# Patient Record
Sex: Male | Born: 1980 | Hispanic: No | Marital: Single | Smoking: Never smoker
Health system: Southern US, Community
[De-identification: ages and names within clinical notes are randomized; demographics above are authoritative.]

## PROBLEM LIST (undated history)

## (undated) DIAGNOSIS — G932 Benign intracranial hypertension: Secondary | ICD-10-CM

## (undated) DIAGNOSIS — E119 Type 2 diabetes mellitus without complications: Secondary | ICD-10-CM

## (undated) DIAGNOSIS — I10 Essential (primary) hypertension: Secondary | ICD-10-CM

## (undated) HISTORY — DX: Benign intracranial hypertension: G93.2

## (undated) HISTORY — PX: EYE SURGERY: SHX253

---

## 2017-06-05 ENCOUNTER — Other Ambulatory Visit: Payer: Self-pay

## 2017-06-05 ENCOUNTER — Emergency Department
Admission: EM | Admit: 2017-06-05 | Discharge: 2017-06-05 | Disposition: A | Discharge status: Home / Self Care | Payer: Self-pay | Attending: Emergency Medicine | Admitting: Emergency Medicine

## 2017-06-05 ENCOUNTER — Encounter: Payer: Self-pay | Admitting: Emergency Medicine

## 2017-06-05 DIAGNOSIS — E119 Type 2 diabetes mellitus without complications: Secondary | ICD-10-CM | POA: Insufficient documentation

## 2017-06-05 DIAGNOSIS — I10 Essential (primary) hypertension: Secondary | ICD-10-CM | POA: Insufficient documentation

## 2017-06-05 DIAGNOSIS — B349 Viral infection, unspecified: Secondary | ICD-10-CM | POA: Insufficient documentation

## 2017-06-05 HISTORY — DX: Type 2 diabetes mellitus without complications: E11.9

## 2017-06-05 LAB — BASIC METABOLIC PANEL
ANION GAP: 8 (ref 5–15)
BUN: 12 mg/dL (ref 6–20)
CO2: 29 mmol/L (ref 22–32)
Calcium: 8.6 mg/dL — ABNORMAL LOW (ref 8.9–10.3)
Chloride: 100 mmol/L — ABNORMAL LOW (ref 101–111)
Creatinine, Ser: 1.47 mg/dL — ABNORMAL HIGH (ref 0.61–1.24)
GFR calc Af Amer: >60 mL/min (ref >60)
GFR, EST NON AFRICAN AMERICAN: 59 mL/min — AB (ref >60)
Glucose, Bld: 179 mg/dL — ABNORMAL HIGH (ref 65–99)
POTASSIUM: 3.1 mmol/L — AB (ref 3.5–5.1)
SODIUM: 137 mmol/L (ref 135–145)

## 2017-06-05 MED ORDER — AMLODIPINE BESYLATE 5 MG PO TABS
5.0000 mg | ORAL_TABLET | Freq: Once | ORAL | Status: AC
  Administered 2017-06-05: 5 mg via ORAL
  Filled 2017-06-05: qty 1

## 2017-06-05 MED ORDER — SODIUM CHLORIDE 0.9 % IV BOLUS (SEPSIS)
1000.0000 mL | Freq: Once | INTRAVENOUS | Status: AC
  Administered 2017-06-05: 1000 mL via INTRAVENOUS

## 2017-06-05 MED ORDER — AMLODIPINE BESYLATE 5 MG PO TABS: 5.0000 mg | ORAL_TABLET | Freq: Every day | ORAL | 1 refills | Status: DC

## 2017-06-05 MED ORDER — ONDANSETRON 4 MG PO TBDP: 4.0000 mg | ORAL_TABLET | Freq: Three times a day (TID) | ORAL | 0 refills | Status: DC | PRN

## 2017-06-05 MED ORDER — POTASSIUM CHLORIDE CRYS ER 20 MEQ PO TBCR
40.0000 meq | EXTENDED_RELEASE_TABLET | Freq: Once | ORAL | Status: AC
  Administered 2017-06-05: 40 meq via ORAL
  Filled 2017-06-05: qty 2

## 2017-06-05 MED ORDER — KETOROLAC TROMETHAMINE 30 MG/ML IJ SOLN
15.0000 mg | INTRAMUSCULAR | Status: AC
  Administered 2017-06-05: 15 mg via INTRAVENOUS
  Filled 2017-06-05: qty 1

## 2017-06-05 NOTE — ED Notes (Signed)
Pt given graham crackers, peanut butter, and diet sprite

## 2017-06-05 NOTE — ED Provider Notes (Signed)
[facility] Emergency Department Provider Note  ____________________________________________  Time seen: Approximately 8:54 PM  I have reviewed the triage vital signs and the nursing notes.   HISTORY  Chief Complaint Fever    HPI William Zuniga is a 36 y.o. male who complains of subjective fevers and chills, sore throat, body aches, nonproductive cough for the past 2 days. He feels tired. Gradual onset, constant, no aggravating or alleviating factors. Mild to moderate intensity. Took some ibuprofen which didn't help. Denies chest pain or shortness of breath. No dizziness or syncope.  Reports in the past she had been told that he had diabetes, used to take metformin but stopped taking a while ago due to financial reasons. No other known medical problems.     Past Medical History:  Diagnosis Date  . Diabetes mellitus without complication (HCC)      There are no active problems to display for this patient.    History reviewed. No pertinent surgical history.   Prior to Admission medications   Medication Sig Start Date End Date Taking? Authorizing Provider  amLODipine (NORVASC) 5 MG tablet Take 1 tablet (5 mg total) by mouth daily. 06/05/17   Sharman CheekStafford, Lakeshia Dohner, MD  ondansetron (ZOFRAN ODT) 4 MG disintegrating tablet Take 1 tablet (4 mg total) by mouth every 8 (eight) hours as needed for nausea or vomiting. 06/05/17   Sharman CheekStafford, Halen Mossbarger, MD     Allergies Patient has no known allergies.   No family history on file.  Social History Social History   Tobacco Use  . Smoking status: Never Smoker  . Smokeless tobacco: Never Used  Substance Use Topics  . Alcohol use: Not on file  . Drug use: Not on file  No alcohol or drug use  Review of Systems  Constitutional:   Positive subjective fevers and chills  ENT:   Positive sore throat. Positive rhinorrhea. Cardiovascular:   No chest pain or syncope. Respiratory:   No dyspnea positive nonproductive  cough. Gastrointestinal:   Negative for abdominal pain, vomiting and diarrhea.  Musculoskeletal:   Negative for focal pain or swelling All other systems reviewed and are negative except as documented above in ROS and HPI.  ____________________________________________   PHYSICAL EXAM:  VITAL SIGNS: ED Triage Vitals  Enc Vitals Group     BP 06/05/17 1721 (!) 228/134     Pulse Rate 06/05/17 1721 (!) 106     Resp 06/05/17 1721 18     Temp 06/05/17 1721 98.4 F (36.9 C)     Temp Source 06/05/17 1721 Oral     SpO2 06/05/17 1721 95 %     Weight 06/05/17 1716 240 lb (108.9 kg)     Height 06/05/17 1716 5\' 9"  (1.753 m)     Head Circumference --      Peak Flow --      Pain Score 06/05/17 1716 7     Pain Loc --      Pain Edu? --      Excl. in GC? --     Vital signs reviewed, nursing assessments reviewed.   Constitutional:   Alert and oriented. Well appearing and in no distress. Eyes:   No scleral icterus.  EOMI. No nystagmus. No conjunctival pallor. PERRL. ENT   Head:   Normocephalic and atraumatic.   Nose:   No congestion/rhinnorhea.    Mouth/Throat:   MMM, no pharyngeal erythema. No peritonsillar mass.    Neck:   No meningismus. Full ROM. Hematological/Lymphatic/Immunilogical:   No cervical  lymphadenopathy. Cardiovascular:   Tachycardia heart rate 105. Symmetric bilateral radial and DP pulses.  No murmurs.  Respiratory:   Normal respiratory effort without tachypnea/retractions. Breath sounds are clear and equal bilaterally. No wheezes/rales/rhonchi. Gastrointestinal:   Soft and nontender. Non distended. There is no CVA tenderness.  No rebound, rigidity, or guarding. Genitourinary:   deferred Musculoskeletal:   Normal range of motion in all extremities. No joint effusions.  No lower extremity tenderness.  No edema. Neurologic:   Normal speech and language.  Motor grossly intact. No gross focal neurologic deficits are appreciated.  Skin:    Skin is warm, dry and  intact. No rash noted.  No petechiae, purpura, or bullae.  ____________________________________________    LABS (pertinent positives/negatives) (all labs ordered are listed, but only abnormal results are displayed) Labs Reviewed  BASIC METABOLIC PANEL - Abnormal; Notable for the following components:      Result Value   Potassium 3.1 (*)    Chloride 100 (*)    Glucose, Bld 179 (*)    Creatinine, Ser 1.47 (*)    Calcium 8.6 (*)    GFR calc non Af Amer 59 (*)    All other components within normal limits   ____________________________________________   EKG    ____________________________________________    RADIOLOGY  No results found.  ____________________________________________   PROCEDURES Procedures  ____________________________________________     CLINICAL IMPRESSION / ASSESSMENT AND PLAN / ED COURSE  Pertinent labs & imaging results that were available during my care of the patient were reviewed by me and considered in my medical decision making (see chart for details).   Patient well-appearing no acute distress, presents with mild tachycardia at 105, significantly elevated blood pressure of about 200/130. Low suspicion of stroke ACS PE or dissection. His symptoms are entirely consistent with a viral URI, but due to his reported history of diabetes without being on any medication, check labs to ensure that he doesn't have any evidence of hyperglycemia or acidosis while ensuring that he can tolerate oral intake. I will plan to start him on amlodipine due to the degree to which his blood pressure is elevated. Recommended he follow up with her primary care doctor.  Clinical Course as of Jun 05 2053  Wed Jun 05, 2017  2018 Tachycardia improved with IVF. Will give oral potassium replacement. Pt tolerating PO. Wil DC home, sx mgmt.   [PS]    Clinical Course User Index [PS] Sharman CheekStafford, Pate Aylward, MD     ____________________________________________   FINAL  CLINICAL IMPRESSION(S) / ED DIAGNOSES    Final diagnoses:  Hypertension, unspecified type  Viral syndrome      This SmartLink is deprecated. Use AVSMEDLIST instead to display the medication list for a patient.   Portions of this note were generated with dragon dictation software. Dictation errors may occur despite best attempts at proofreading.    Sharman CheekStafford, Nickholas Goldston, MD 06/05/17 2059

## 2017-06-05 NOTE — ED Notes (Signed)
Pt c/o cough, congestion, weakness with generalized body aches for 2-3 days - pt also c/o bilat eye redness (denies itching) Pt c/o hazy vision and has elevated BP of 199/128 at this time

## 2017-06-05 NOTE — ED Triage Notes (Signed)
C/O fever, sore throat, body aches x 2 days.

## 2017-06-05 NOTE — Discharge Instructions (Signed)
Your blood sugar today was 170. You should follow up with a primary care doctor for continued monitoring of your blood pressure and blood sugar.

## 2017-07-26 ENCOUNTER — Emergency Department (HOSPITAL_COMMUNITY): Payer: Medicaid Other

## 2017-07-26 ENCOUNTER — Other Ambulatory Visit: Payer: Self-pay

## 2017-07-26 ENCOUNTER — Inpatient Hospital Stay (HOSPITAL_COMMUNITY)
Admission: EM | Admit: 2017-07-26 | Discharge: 2017-07-31 | DRG: 103 | Disposition: A | Discharge status: Other Acute Inpatient Hospital | Payer: Medicaid Other | Attending: Internal Medicine | Admitting: Internal Medicine

## 2017-07-26 ENCOUNTER — Encounter (HOSPITAL_COMMUNITY): Payer: Self-pay | Admitting: *Deleted

## 2017-07-26 DIAGNOSIS — E876 Hypokalemia: Secondary | ICD-10-CM | POA: Diagnosis present

## 2017-07-26 DIAGNOSIS — E118 Type 2 diabetes mellitus with unspecified complications: Secondary | ICD-10-CM | POA: Diagnosis not present

## 2017-07-26 DIAGNOSIS — I6783 Posterior reversible encephalopathy syndrome: Secondary | ICD-10-CM | POA: Diagnosis present

## 2017-07-26 DIAGNOSIS — R509 Fever, unspecified: Secondary | ICD-10-CM | POA: Diagnosis not present

## 2017-07-26 DIAGNOSIS — J811 Chronic pulmonary edema: Secondary | ICD-10-CM | POA: Diagnosis present

## 2017-07-26 DIAGNOSIS — R0600 Dyspnea, unspecified: Secondary | ICD-10-CM | POA: Diagnosis not present

## 2017-07-26 DIAGNOSIS — G932 Benign intracranial hypertension: Secondary | ICD-10-CM

## 2017-07-26 DIAGNOSIS — I161 Hypertensive emergency: Secondary | ICD-10-CM | POA: Diagnosis present

## 2017-07-26 DIAGNOSIS — E119 Type 2 diabetes mellitus without complications: Secondary | ICD-10-CM | POA: Diagnosis not present

## 2017-07-26 DIAGNOSIS — I10 Essential (primary) hypertension: Secondary | ICD-10-CM | POA: Diagnosis present

## 2017-07-26 DIAGNOSIS — E669 Obesity, unspecified: Secondary | ICD-10-CM | POA: Diagnosis not present

## 2017-07-26 DIAGNOSIS — Z6841 Body Mass Index (BMI) 40.0 and over, adult: Secondary | ICD-10-CM

## 2017-07-26 DIAGNOSIS — E139 Other specified diabetes mellitus without complications: Secondary | ICD-10-CM | POA: Diagnosis present

## 2017-07-26 DIAGNOSIS — E1165 Type 2 diabetes mellitus with hyperglycemia: Secondary | ICD-10-CM | POA: Diagnosis not present

## 2017-07-26 DIAGNOSIS — H547 Unspecified visual loss: Secondary | ICD-10-CM

## 2017-07-26 DIAGNOSIS — I169 Hypertensive crisis, unspecified: Secondary | ICD-10-CM

## 2017-07-26 DIAGNOSIS — H4710 Unspecified papilledema: Secondary | ICD-10-CM | POA: Diagnosis present

## 2017-07-26 DIAGNOSIS — Z8249 Family history of ischemic heart disease and other diseases of the circulatory system: Secondary | ICD-10-CM

## 2017-07-26 DIAGNOSIS — N179 Acute kidney failure, unspecified: Secondary | ICD-10-CM | POA: Diagnosis not present

## 2017-07-26 DIAGNOSIS — J9600 Acute respiratory failure, unspecified whether with hypoxia or hypercapnia: Secondary | ICD-10-CM

## 2017-07-26 DIAGNOSIS — Z79899 Other long term (current) drug therapy: Secondary | ICD-10-CM | POA: Diagnosis not present

## 2017-07-26 DIAGNOSIS — E1159 Type 2 diabetes mellitus with other circulatory complications: Secondary | ICD-10-CM | POA: Diagnosis present

## 2017-07-26 DIAGNOSIS — Z9114 Patient's other noncompliance with medication regimen: Secondary | ICD-10-CM | POA: Diagnosis not present

## 2017-07-26 HISTORY — DX: Essential (primary) hypertension: I10

## 2017-07-26 LAB — BASIC METABOLIC PANEL
ANION GAP: 11 (ref 5–15)
BUN: 11 mg/dL (ref 6–20)
CALCIUM: 9.3 mg/dL (ref 8.9–10.3)
CHLORIDE: 101 mmol/L (ref 101–111)
CO2: 25 mmol/L (ref 22–32)
Creatinine, Ser: 1.27 mg/dL — ABNORMAL HIGH (ref 0.61–1.24)
GFR calc non Af Amer: >60 mL/min (ref >60)
GLUCOSE: 255 mg/dL — AB (ref 65–99)
POTASSIUM: 3.5 mmol/L (ref 3.5–5.1)
Sodium: 137 mmol/L (ref 135–145)

## 2017-07-26 LAB — MAGNESIUM: MAGNESIUM: 1.9 mg/dL (ref 1.7–2.4)

## 2017-07-26 LAB — CBC
HEMATOCRIT: 43.5 % (ref 39.0–52.0)
Hemoglobin: 14.9 g/dL (ref 13.0–17.0)
MCH: 30.4 pg (ref 26.0–34.0)
MCHC: 34.3 g/dL (ref 30.0–36.0)
MCV: 88.8 fL (ref 78.0–100.0)
Platelets: 317 10*3/uL (ref 150–400)
RBC: 4.9 MIL/uL (ref 4.22–5.81)
RDW: 13.3 % (ref 11.5–15.5)
WBC: 7.8 10*3/uL (ref 4.0–10.5)

## 2017-07-26 LAB — GLUCOSE, CAPILLARY
GLUCOSE-CAPILLARY: 148 mg/dL — AB (ref 65–99)
Glucose-Capillary: 200 mg/dL — ABNORMAL HIGH (ref 65–99)

## 2017-07-26 LAB — BRAIN NATRIURETIC PEPTIDE: B Natriuretic Peptide: 35.6 pg/mL (ref 0.0–100.0)

## 2017-07-26 LAB — I-STAT TROPONIN, ED: TROPONIN I, POC: 0.04 ng/mL (ref 0.00–0.08)

## 2017-07-26 LAB — HEMOGLOBIN A1C
Hgb A1c MFr Bld: 8.9 % — ABNORMAL HIGH (ref 4.8–5.6)
MEAN PLASMA GLUCOSE: 208.73 mg/dL

## 2017-07-26 MED ORDER — NICARDIPINE HCL IN NACL 20-0.86 MG/200ML-% IV SOLN
5.0000 mg/h | Freq: Once | INTRAVENOUS | Status: AC
  Administered 2017-07-26: 5 mg/h via INTRAVENOUS
  Filled 2017-07-26: qty 200

## 2017-07-26 MED ORDER — NICARDIPINE HCL IN NACL 20-0.86 MG/200ML-% IV SOLN
3.0000 mg/h | INTRAVENOUS | Status: DC
  Administered 2017-07-26: 3 mg/h via INTRAVENOUS
  Administered 2017-07-27 (×2): 5 mg/h via INTRAVENOUS
  Filled 2017-07-26 (×3): qty 200

## 2017-07-26 MED ORDER — POTASSIUM CHLORIDE CRYS ER 20 MEQ PO TBCR
40.0000 meq | EXTENDED_RELEASE_TABLET | Freq: Once | ORAL | Status: AC
  Administered 2017-07-26: 40 meq via ORAL
  Filled 2017-07-26: qty 2

## 2017-07-26 MED ORDER — GADOBENATE DIMEGLUMINE 529 MG/ML IV SOLN
20.0000 mL | Freq: Once | INTRAVENOUS | Status: AC
  Administered 2017-07-26: 20 mL via INTRAVENOUS

## 2017-07-26 MED ORDER — NICARDIPINE HCL IN NACL 20-0.86 MG/200ML-% IV SOLN
INTRAVENOUS | Status: AC
  Filled 2017-07-26: qty 200

## 2017-07-26 MED ORDER — LORAZEPAM 2 MG/ML IJ SOLN
1.0000 mg | Freq: Once | INTRAMUSCULAR | Status: AC
  Administered 2017-07-26: 1 mg via INTRAVENOUS
  Filled 2017-07-26: qty 1

## 2017-07-26 NOTE — ED Provider Notes (Signed)
[facility] EMERGENCY DEPARTMENT Provider Note   CSN: [id number] Arrival date & time: 07/26/17  1216     History   Chief Complaint Chief Complaint  Patient presents with  . Hypertension     HPI   Blood pressure (!) 196/130, pulse 72, temperature 98 F (36.7 C), temperature source Oral, resp. rate 18, SpO2 100 %.  William Zuniga is a 37 y.o. male from retinal specialist for evaluation of hypertensive emergency, with decreased vision and disc edema..  Patient has had decreasing vision in the bilateral eyes worsening over the course of the last several months, he states he does not drive anymore and he states he actually has difficulty walking due to decrease in his vision.  He does not have a primary care secondary to insurance issues.  He was seen at the ED several months ago and was given a prescription for amlodipine which he says he is compliant with, he takes it every night.  He did not take it last night however.  He had chest pain several months ago but this resolves he has persistent shortness of breath and dyspnea on exertion.  He denies any orthopnea, PND, active chest pain.  He denies any tobacco use, cocaine or methamphetamine use he drinks alcohol intermittently.  Past Medical History:  Diagnosis Date  . Diabetes mellitus without complication (HCC)   . Hypertension     There are no active problems to display for this patient.   History reviewed. No pertinent surgical history.     Home Medications    Prior to Admission medications   Medication Sig Start Date End Date Taking? Authorizing Provider  amLODipine (NORVASC) 5 MG tablet Take 1 tablet (5 mg total) by mouth daily. 06/05/17   Sharman Cheek, MD  ondansetron (ZOFRAN ODT) 4 MG disintegrating tablet Take 1 tablet (4 mg total) by mouth every 8 (eight) hours as needed for nausea or vomiting. 06/05/17   Sharman Cheek, MD    Family History History reviewed. No pertinent family  history.  Social History Social History   Tobacco Use  . Smoking status: Never Smoker  . Smokeless tobacco: Never Used  Substance Use Topics  . Alcohol use: No    Frequency: Never  . Drug use: No     Allergies   Patient has no known allergies.   Review of Systems Review of Systems  A complete review of systems was obtained and all systems are negative except as noted in the HPI and PMH.   Physical Exam Updated Vital Signs BP (!) 200/129   Pulse 77   Temp 98 F (36.7 C) (Oral)   Resp 18   SpO2 98%   Physical Exam  Constitutional: He is oriented to person, place, and time. He appears well-developed and well-nourished. No distress.  HENT:  Head: Normocephalic and atraumatic.  Mouth/Throat: Oropharynx is clear and moist.  Eyes: Conjunctivae and EOM are normal.  Pupils dilated  Neck: Normal range of motion.  Cardiovascular: Normal rate, regular rhythm and intact distal pulses.  Equal pulses bilaterally  Pulmonary/Chest: Effort normal and breath sounds normal. No stridor. No respiratory distress. He has no wheezes. He has no rales. He exhibits no tenderness.  Abdominal: Soft. He exhibits no distension and no mass. There is no tenderness. There is no rebound and no guarding. No hernia.  Musculoskeletal: Normal range of motion. He exhibits no edema.  Neurological: He is alert and oriented to person, place, and time.  V/VII-Smile symmetric, equal eyebrow  raise,  facial sensation intact VIII- Hearing grossly intact IX/X-Normal gag XI-bilateral shoulder shrug XII-midline tongue extension Motor: 5/5 bilaterally with normal tone and bulk    Skin: Capillary refill takes less than 2 seconds. He is not diaphoretic.  Psychiatric: He has a normal mood and affect.  Nursing note and vitals reviewed.    ED Treatments / Results  Labs (all labs ordered are listed, but only abnormal results are displayed) Labs Reviewed  BASIC METABOLIC PANEL - Abnormal; Notable for the  following components:      Result Value   Glucose, Bld 255 (*)    Creatinine, Ser 1.27 (*)    All other components within normal limits  CBC  BRAIN NATRIURETIC PEPTIDE  MAGNESIUM  I-STAT TROPONIN, ED    EKG  EKG Interpretation  Date/Time:  Friday July 26 2017 12:26:25 EST Ventricular Rate:  74 PR Interval:  156 QRS Duration: 104 QT Interval:  388 QTC Calculation: 430 R Axis:   61 Text Interpretation:  Normal sinus rhythm Inferior infarct , age undetermined ST & T wave abnormality, consider lateral ischemia Abnormal ECG No old tracing to compare Confirmed by Shaune PollackIsaacs, Cameron [phone]([id number]) on 07/26/2017 1:58:26 PM       Radiology Dg Chest 2 View  Result Date: 07/26/2017 CLINICAL DATA:  Hypertensive crisis. Occasional shortness of breath and chest pain. EXAM: CHEST  2 VIEW COMPARISON:  None. FINDINGS: Cardiomegaly and pulmonary vascular congestion noted. There is no evidence of focal airspace disease, pulmonary edema, suspicious pulmonary nodule/mass, pleural effusion, or pneumothorax. No acute bony abnormalities are identified. IMPRESSION: Cardiomegaly with pulmonary vascular congestion. Electronically Signed   By: Harmon PierJeffrey  Hu M.D.   On: 07/26/2017 12:56    Procedures Procedures (including critical care time)  CRITICAL CARE Performed by: Joni ReiningNicole Hannahmarie Asberry   Total critical care time: 45 minutes  Critical care time was exclusive of separately billable procedures and treating other patients.  Critical care was necessary to treat or prevent imminent or life-threatening deterioration.  Critical care was time spent personally by me on the following activities: development of treatment plan with patient and/or surrogate as well as nursing, discussions with consultants, evaluation of patient's response to treatment, examination of patient, obtaining history from patient or surrogate, ordering and performing treatments and interventions, ordering and review of laboratory studies, ordering  and review of radiographic studies, pulse oximetry and re-evaluation of patient's condition.   Medications Ordered in ED Medications  potassium chloride SA (K-DUR,KLOR-CON) CR tablet 40 mEq (not administered)  nicardipine (CARDENE) 20mg  in 0.86% saline 200ml IV infusion (0.1 mg/ml) (5 mg/hr Intravenous New Bag/Given 07/26/17 1454)     Initial Impression / Assessment and Plan / ED Course  I have reviewed the triage vital signs and the nursing notes.  Pertinent labs & imaging results that were available during my care of the patient were reviewed by me and considered in my medical decision making (see chart for details).     Vitals:   07/26/17 1223 07/26/17 1224 07/26/17 1430  BP: (!) 211/125 (!) 196/130 (!) 200/129  Pulse: 72  77  Resp: 18    Temp: 98 F (36.7 C)    TempSrc: Oral    SpO2: 100%  98%    Medications  potassium chloride SA (K-DUR,KLOR-CON) CR tablet 40 mEq (not administered)  nicardipine (CARDENE) 20mg  in 0.86% saline 200ml IV infusion (0.1 mg/ml) (5 mg/hr Intravenous New Bag/Given 07/26/17 1454)    Geralyn FlashJoseph Sanjuan is 37 y.o. male presenting with hypertensive emergency with endorgan damage,  decreased vision, disc edema, patient with shortness of breath, chest x-ray consistent with CHF.  BNP pending, EKG with nonspecific changes.  Patient started on nicardipine drip. Goal SBP 160. Mild hypokalemia, elevated glucose with normal anion gap, creatinine mildly elevated at 1.27.  Unassigned admission to [facility], head CT ordered and pending.    Final Clinical Impressions(s) / ED Diagnoses   Final diagnoses:  Hypertensive crisis    ED Discharge Orders    None       Lisa Blakeman, Mardella Layman 07/26/17 1500    Shaune Pollack, MD 07/26/17 1611    Shaune Pollack, MD 07/26/17 1718    Shaune Pollack, MD 07/26/17 Paulo Fruit

## 2017-07-26 NOTE — ED Triage Notes (Signed)
Pt sent here from dr office. Reports having vision changes since dec. Went to dr today and sent here for hypertensive crisis, causing disc edema in bilateral eyes and blindness. Has occ sob. No acute distress is noted at triage.

## 2017-07-26 NOTE — ED Notes (Signed)
Patient to go upstairs when he returns from MRI.

## 2017-07-26 NOTE — H&P (Signed)
[facility] Pulmonary Diseases & Critical Care Medicine HISTORY & PHYSICAL  Patient Name: William Zuniga MRN: [id number] DOB: [birth year]    ADMISSION DATE:  07/26/2017 DATE OF SERVICE:  07/26/2017  CHIEF COMPLAINT:  B papilledema   HISTORY OF PRESENT ILLNESS  This 37 y.o. 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 The patient has been experiencing rapidly progressive deterioration of his visual acuity over the past several months. He does not discern light and dark at this time. He used to have normal vision and was able to drive. 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 The patient's wife is at the bedside. She reports that she can detect that the patient's blood pressure is elevated due to changes in personality and behavior. In the ER, the patient has apparently "required" Cardene gtt for blood pressure control, prompting request to admit to ICU.  He has had hypertension for many years. He admits to nonadherence to prescribed medications, citing lack of health insurance coverage. He also claims to be diabetic, which he thinks was first diagnosed at age 67 or 47. He was briefly on insulin but says that he was "able to get off of insulin".  REVIEW OF SYSTEMS Constitutional: No weight loss. No night sweats. No fever. No chills. No fatigue. HEENT: No headaches, dysphagia, sore throat, otalgia, nasal congestion, PND CV:  chest pain. No orthopnea, PND, swelling in lower extremities, palpitations GI:  No abdominal pain, nausea, vomiting, diarrhea, change in bowel pattern, anorexia Resp: DOE. No cough, mucus, hemoptysis, wheezing  GU: no dysuria, change in color of urine, no urgency or frequency.  No flank pain. MS:  No joint pain or swelling. No myalgias,   No decreased range of motion.  Psych:  No change in mood or affect. No memory loss. Skin: no rash or lesions.   PAST MEDICAL/SURGICAL/SOCIAL/FAMILY HISTORIES   Past Medical History:  Diagnosis Date  . Diabetes mellitus without complication (HCC)   . Hypertension     History reviewed. No pertinent surgical history.  Social History   Tobacco Use  . Smoking status: Never Smoker  . Smokeless tobacco: Never Used  Substance Use Topics  . Alcohol use: No    Frequency: Never    History reviewed. No pertinent family history.   No Known Allergies   Prior to Admission medications   Medication Sig Start Date End Date Taking? Authorizing Provider  amLODipine (NORVASC) 5 MG tablet Take 1 tablet (5 mg total) by mouth daily. 06/05/17  Yes Sharman Cheek, MD  ondansetron (ZOFRAN ODT) 4 MG disintegrating tablet Take 1 tablet (4 mg total) by mouth every 8 (eight) hours as needed for nausea or vomiting. Patient not taking: Reported on 07/26/2017 06/05/17   Sharman Cheek, MD    Current Facility-Administered Medications  Medication Dose Route Frequency Provider Last Rate Last Dose  . niCARdipine in saline (CARDENE-IV) 20-0.86 MG/200ML-% infusion SOLN            Current Outpatient Medications  Medication Sig Dispense Refill  . amLODipine (NORVASC) 5 MG tablet Take 1 tablet (5 mg total) by mouth daily. 30 tablet 1  . ondansetron (ZOFRAN ODT) 4 MG disintegrating tablet Take 1 tablet (4 mg total) by mouth every 8 (eight) hours as needed for nausea or vomiting. (Patient not taking: Reported  on 07/26/2017) 20 tablet 0     VITAL SIGNS: BP (!) 149/91   Pulse 95   Temp 98 F (36.7 C) (Oral)   Resp (!) 23   SpO2 95%   HEMODYNAMICS:    VENTILATOR SETTINGS:    INTAKE / OUTPUT: No intake/output data recorded.  PHYSICAL EXAMINATION: General:  Pleasant. Well-developed. Alert, awake and oriented to time, person and place. Cooperative. No acute distress. Normal affect. Head:  normocephalic, atraumatic Nose: nares are patent. No polyps. No exudate. No sinus tenderness. Throat/Oral Cavity: Normal dentition. No oral thrush. No exudate. Mucous membranes are moist. No tonsillar enlargement. Neck: supple, no thyromegaly, no JVD, no lymphadenopathy. Trachea midline. Chest/Lung: symmetric in development and expansion. Good air entry. no crackles. No wheezes. Heart: Regular S1 and S2 without murmur, rub or gallop. Abdomen: soft, nontender, nondistended. Normoactive bowel sounds. n rebound. No guarding. Extremities: no pedal edema, no cyanosis, no clubbing. 2+ DP pulses Lymphatic: no cervical/axiallary/inguinal lymph nodes appreciated Skin:  No rash or lesion. NEURO: cranial nerves II-XII are grossly symmetric and physiologic. Babinski absent. No sensory deficit. No motor deficit. DTR 2+ @ RUE, 2+ @ LUE 2+ @ RLL,  2+ @ LLL. No cerebellar signs. Gait was not assessed.   LABS:  BMET Recent Labs  Lab 07/26/17 1222  NA 137  K 3.5  CL 101  CO2 25  BUN 11  CREATININE 1.27*  GLUCOSE 255*    Electrolytes Recent Labs  Lab 07/26/17 1222  CALCIUM 9.3  MG 1.9    CBC Recent Labs  Lab 07/26/17 1222  WBC 7.8  HGB 14.9  HCT 43.5  PLT 317    Coag's No results for input(s): APTT, INR in the last 168 hours.  Sepsis Markers No results for input(s): LATICACIDVEN, PROCALCITON, O2SATVEN in the last 168 hours.  ABG No results for input(s): PHART, PCO2ART, PO2ART in the last 168 hours.  Liver Enzymes No results for input(s): AST, ALT, ALKPHOS, BILITOT, ALBUMIN in the last 168 hours.  Cardiac Enzymes No results for input(s): TROPONINI, PROBNP in the last 168 hours.  Glucose No results for input(s): GLUCAP in the last 168 hours.  Imaging Dg Chest 2 View  Result Date: 07/26/2017 CLINICAL DATA:  Hypertensive crisis. Occasional shortness of breath and chest pain. EXAM: CHEST  2 VIEW COMPARISON:  None. FINDINGS: Cardiomegaly and pulmonary vascular congestion  noted. There is no evidence of focal airspace disease, pulmonary edema, suspicious pulmonary nodule/mass, pleural effusion, or pneumothorax. No acute bony abnormalities are identified. IMPRESSION: Cardiomegaly with pulmonary vascular congestion. Electronically Signed   By: Harmon PierJeffrey  Hu M.D.   On: 07/26/2017 12:56   Ct Head Wo Contrast  Result Date: 07/26/2017 CLINICAL DATA:  Neuro deficits, subacute. Bilateral visual changes. Hypertensive crisis. Disc edema. EXAM: CT HEAD WITHOUT CONTRAST TECHNIQUE: Contiguous axial images were obtained from the base of the skull through the vertex without intravenous contrast. COMPARISON:  None. FINDINGS: Brain: Bilateral subcortical white matter hypoattenuation is advanced for age. This is predominantly in the frontal lobes. No acute cortical infarct, hemorrhage, or mass lesion is present. Ventricles are of normal size. Basal ganglia are intact. The brainstem and cerebellum are normal. Vascular: No hyperdense vessel or unexpected calcification. Skull: Calvarium is intact. No focal lytic or blastic lesion is present. Sinuses/Orbits: The paranasal sinuses and mastoid air cells are clear. The globes and orbits are within normal limits. No mass lesion is present. Cavernous sinus is normal bilaterally. Sella is unremarkable. IMPRESSION: 1. Bilateral subcortical white matter hypoattenuation is moderately  advanced for age. The pattern is more anterior than posterior, atypical for posterior reversible encephalopathy syndrome. This raises concern for chronic underlying microvascular disease. 2. No acute cortical defect. 3. No evidence for hemorrhage. Electronically Signed   By: Marin Roberts M.D.   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 SBP goal for tonight 160 Consult social work re: assistance w/medications, appointments Fasting lipid panel in am Check HbA1c Accuchecks AC HS Continuous oximetry. This patient should undergo diagnostic polysomnography to rule out sleep-disordered breathing.  NUTRITION: cardiac/diabetic  DVT PROPHYLAXIS: SCDs for now GI PROPHYLAXIS: famotidine   Marcelle Smiling, MD Board Certified by the ABIM, Pulmonary Diseases & Critical Care Medicine  [facility] Pager: [phone]  07/26/2017, 7:00 PM

## 2017-07-26 NOTE — H&P (Signed)
Date: 07/26/2017               Patient Name:  William Zuniga MRN: [id number]  DOB: 19-Apr-1981 Age / Sex: 37 y.o., male   PCP: Patient, No Pcp Per         Medical Service: Internal Medicine Teaching Service         Attending Physician: Dr. Shaune Pollack, MD    First Contact: Dr. Evelene Croon Pager: [phone]  Second Contact: Dr. Obie Dredge  Pager [phone]       After Hours (After 5p/  First Contact Pager: [phone]  weekends / holidays): Second Contact Pager: [phone]   Chief Complaint: Decreased vision   History of Present Illness:  William Zuniga is a 37 y.o. Male with history of HTN and non-insulin dependent T2DM who presents to the ED from ophthalmology office with papilledema and decreased vision. Patient is accompanied by girlfriend/wife. Patient was diagnosed with HTN about 1 year ago (he is unable to specify) after an ED visit. He did not establish with a PCP after this diagnosis. He takes his BP at home at random times and is sBP usually 170-190s. A few months ago he was seen in the ED in [place] and noted to be hypertensive. He was prescribed amlodipine 5 mg which he has been taking daily. Some time after Thanksgiving, he noticed changes in his vision. He wears eye glasses and thought this was normal. Currently, he is only able to distinguish some colors and shapes. His vision continued to worsen and he decide to schedule an appointment with ophthalmology today.  During his appointment, he was found to have a BP of 171/93 and had bilateral papilledema, and was sent to the ED for HTN management. Per office note, patient is currently legally blind. For the past 2 months he has been experiencing daily headaches and has noticed increased fatigued especially during exertion.  He also endorses mild chest pain on exertion.  He has not noticed any changes in his weight.  Denies recent illness, fever, chills, active chest pain, shortness of breath, abdominal pain, nausea or vomiting, changes in bowel  movements, and lower extremity swelling.  ED course: Patient's ;BP 228/134 on arrival to the ED. Labwork remarkable for hypokalemia K 3.1, I-stat troponin of 0.04 and mild AKI SCr 1.2. BNP 35.6. EKG with TWI in I, II, III, aVF, V5-V6. CXR consistent with pulmonary edema. Head CT with no acute intracranial abnormalities, but concerning for chronic underlying microvascular disease.  He was started on a nicardipine drip and received K-Dur 40 mEq x1.   Review of Systems: A complete ROS was negative except as per HPI.   Meds:  Current Meds  Medication Sig  . amLODipine (NORVASC) 5 MG tablet Take 1 tablet (5 mg total) by mouth daily.     Allergies: Allergies as of 07/26/2017  . (No Known Allergies)   Past Medical History:  Diagnosis Date  . Diabetes mellitus without complication (HCC)   . Hypertension     Family History:  HTN - mother, grandmother  MI - father   Social History:  Patient states he is a "social drinker", maybe one drink per month, but could be less. Never smoker. Denied drug use.    Physical Exam: Blood pressure (!) 149/91, pulse 95, temperature 98 F (36.7 C), temperature source Oral, resp. rate (!) 23, SpO2 95 %.  General: pleasant male, obese, well-developed, unable to see, lying in bed in no acute distress  HENT: NCAT, neck supple  and FROM, OP clear without exudates or erythema, nl dentition  Eyes: pupils dilated and unresponsive (dilated at ophthalmologist office) Cardiac: regular rate and rhythm, nl S1/S2, no murmurs, rubs or gallops, no JVD  Pulm: CTAB, no wheezes or crackles, no increased work of breathing  Abd: soft, NTND, bowel sounds present, no rebound tenderness Neuro: A&Ox3, CN III-XII intact, blind, sensation intact in all four extremities, no motor deficits, nl finger-to-nose testing, nl heel-to shin  Ext: warm and well perfused, no Le pitting edema noted     EKG: personally reviewed my interpretation is TWI in I, II, III, aVF, V5 and V6, no ST  depressions or ST elevations, ?Q wave in lead III. No prior EKG to compare.   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 In the ED, he was found to have a BP 220 as well as evidence of end-organ damage including mild troponinemia, pulmonary edema, and AKI. Head CT without acute abnormalities. Will admit to ICU for optimal BP monitoring and antihypertensive titration.  - Continue nicardipine gtt with close titration - Goal to decrease BP by 10-20% in the first hour then 20% more over next 23 hours  - PCCM consulted and patient will be admitted to the ICU for close BP monitoring  - Discussed with patient severity of his medical condition and high risk for stroke, MI, and CKD, and permanent vision loss. However, I do not think patient understands the severity of his situation. Will continue to educate patient.   # T2DM: He is non-insulin dependent. No A1c available to review. Not on any medications at home.  - Check A1c  - CBG monitoring   F: none  E: monitoring and repleting PRN  N: HH diet   VTE ppx: SQ Lovenox   Code status: Full code, confirmed on admission   Dispo: Admit patient to Inpatient with expected length of stay greater than 2 midnights.  SignedBurna Cash: Santos-Sanchez, [place], MD 07/26/2017, 7:21 PM  Pager: [phone]

## 2017-07-26 NOTE — Progress Notes (Signed)
CSW received consult. CSW went to meet with pt. Pt is currently not in the room. CSW will check back.  William Zuniga, Silverio LayLCSWA [facility]  [phone]

## 2017-07-26 NOTE — Progress Notes (Signed)
Inpatient CSW and Case Manager can follow up on pt's needs as his discharge gets closer.   Montine CircleKelsy Lilburn Straw, Silverio LayLCSWA [facility]  [phone]

## 2017-07-26 NOTE — ED Notes (Signed)
Patient transported to MRI 

## 2017-07-26 NOTE — Consult Note (Signed)
NEURO HOSPITALIST CONSULT NOTE   Requestig physician: Dr. Erma HeritageIsaacs  Reason for Consult: Possible PRES  History obtained from:   Patient and Chart    HPI:                                                                                                                                          William Zuniga is an 37 y.o. male who was sent to the ED from the ophthalmologist's office after exam there revealed severe HTN, disc edema and severe loss of visual acuity OU. His vision first started to become blurry in November. The blurring was bilateral and waxed and waned. He works as a Engineer, [employer] and states today that "I just tried to work through it". Eventually his vision became so impaired that he had to stop working.   On arrival to the ED, blood pressure (!) 196/130, pulse 72, temperature 98 F (36.7 C), resp. rate 18, SpO2 100 %.  ED physician's note reviewed: "Patient has had decreasing vision in the bilateral eyes worsening over the course of the last several months, he states he does not drive anymore and he states he actually has difficulty walking due to decrease in his vision.  He does not have a primary care secondary to insurance issues.  He was seen at the ED several months ago and was given a prescription for amlodipine which he says he is compliant with, he takes it every night.  He did not take it last night however.  He had chest pain several months ago but this resolves he has persistent shortness of breath and dyspnea on exertion.  He denies any orthopnea, PND, active chest pain.  He denies any tobacco use, cocaine or methamphetamine use he drinks alcohol intermittently."  CT head obtained in the ED revealed bilateral subcortical white matter hypoattenuation, which is moderately advanced for age. The pattern is more anterior than posterior, atypical for posterior reversible encephalopathy syndrome. This raises concern for chronic underlying microvascular disease.    Past Medical History:  Diagnosis Date  . Diabetes mellitus without complication (HCC)   . Hypertension     History reviewed. No pertinent surgical history.  History reviewed. No pertinent family history.  Social History:  reports that  has never smoked. he has never used smokeless tobacco. He reports that he does not drink alcohol or use drugs.  No Known Allergies  HOME MEDICATIONS:  Amlodipine Ondansetron  ROS:                                                                                                                                       Occasional SOB. Mild 6/10 headache.   Blood pressure (!) 151/86, pulse 89, temperature 98 F (36.7 C), temperature source Oral, resp. rate 17, SpO2 93 %.  General Examination:                                                                                                      HEENT-  [place]/AT  Lungs- Respirations unlabored. No grossly audible wheezing.  Extremities- Warm and well perfused  Neurological Examination Mental Status: Alert, oriented, thought content appropriate.  Speech fluent without evidence of aphasia.  Able to follow all commands without difficulty. Cranial Nerves: II: Severe visual field constriction present bilaterally, worse on the left. Central vision severely impaired; able to see colors and gross shapes, but is unable to count fingers. Pupils 5 mm bilaterally in dim light, constricting to 4 mm with penlight.  III,IV, VI: EOMI without nystagmus. No ptosis.  V,VII: No facial droop. Facial temp sensation normal bilaterally VIII: Hearing intact to conversation IX,X: Palate rises symmetrically XI: Symmetric XII: midline tongue extension Motor: Right : Upper extremity   5/5    Left:     Upper extremity   5/5  Lower extremity   5/5     Lower extremity   5/5 Normal tone throughout; no atrophy noted Sensory:  Temp and light touch intact proximally x 4. No extinction.  Deep Tendon Reflexes: 2+ and symmetric throughout Plantars: Right: downgoing  Left: downgoing Cerebellar: No ataxia with alternating finger tap of navel and tip of nose bilaterally. Unable to perform FNF due to vision loss.  Gait: Deferred due to falls risk concerns  Lab Results: Basic Metabolic Panel: Recent Labs  Lab 07/26/17 1222  NA 137  K 3.5  CL 101  CO2 25  GLUCOSE 255*  BUN 11  CREATININE 1.27*  CALCIUM 9.3    Liver Function Tests: No results for input(s): AST, ALT, ALKPHOS, BILITOT, PROT, ALBUMIN in the last 168 hours. No results for input(s): LIPASE, AMYLASE in the last 168 hours. No results for input(s): AMMONIA in the last 168 hours.  CBC: Recent Labs  Lab 07/26/17 1222  WBC 7.8  HGB 14.9  HCT 43.5  MCV 88.8  PLT 317    Cardiac Enzymes: No results for input(s): CKTOTAL, CKMB, CKMBINDEX, TROPONINI in  the last 168 hours.  Lipid Panel: No results for input(s): CHOL, TRIG, HDL, CHOLHDL, VLDL, LDLCALC in the last 168 hours.  CBG: No results for input(s): GLUCAP in the last 168 hours.  Microbiology: No results found for this or any previous visit.  Coagulation Studies: No results for input(s): LABPROT, INR in the last 72 hours.  Imaging: Dg Chest 2 View  Result Date: 07/26/2017 CLINICAL DATA:  Hypertensive crisis. Occasional shortness of breath and chest pain. EXAM: CHEST  2 VIEW COMPARISON:  None. FINDINGS: Cardiomegaly and pulmonary vascular congestion noted. There is no evidence of focal airspace disease, pulmonary edema, suspicious pulmonary nodule/mass, pleural effusion, or pneumothorax. No acute bony abnormalities are identified. IMPRESSION: Cardiomegaly with pulmonary vascular congestion. Electronically Signed   By: Harmon Pier M.D.   On: 07/26/2017 12:56   Ct Head Wo Contrast  Result Date: 07/26/2017 CLINICAL DATA:  Neuro deficits, subacute. Bilateral visual changes. Hypertensive  crisis. Disc edema. EXAM: CT HEAD WITHOUT CONTRAST TECHNIQUE: Contiguous axial images were obtained from the base of the skull through the vertex without intravenous contrast. COMPARISON:  None. FINDINGS: Brain: Bilateral subcortical white matter hypoattenuation is advanced for age. This is predominantly in the frontal lobes. No acute cortical infarct, hemorrhage, or mass lesion is present. Ventricles are of normal size. Basal ganglia are intact. The brainstem and cerebellum are normal. Vascular: No hyperdense vessel or unexpected calcification. Skull: Calvarium is intact. No focal lytic or blastic lesion is present. Sinuses/Orbits: The paranasal sinuses and mastoid air cells are clear. The globes and orbits are within normal limits. No mass lesion is present. Cavernous sinus is normal bilaterally. Sella is unremarkable. IMPRESSION: 1. Bilateral subcortical white matter hypoattenuation is moderately advanced for age. The pattern is more anterior than posterior, atypical for posterior reversible encephalopathy syndrome. This raises concern for chronic underlying microvascular disease. 2. No acute cortical defect. 3. No evidence for hemorrhage. Electronically Signed   By: Marin Roberts M.D.   On: 07/26/2017 15:30    Assessment: 37 year old male with a 2 month history of progessive vision loss 1. Papilledema on retinal exam at ophthalmologist's office today, per report. Also with severe constriction of visual fields and severe impairment of central visual acuity bilaterally. CT head more concerning for hypertensive encephalopathy or age-advanced chronic small vessel ischemic changes, than for PRES. Pseudotumor cerebri is felt to be the most likely component of the DDx, but MRI brain may reveal findings more consistent with PRES. Given headache with papilledema, cryptococcal meningitis is also on the DDx.  2. Hypertensive urgency 3. DM  Recommendations: 1. MRI brain and orbits with and without  contrast 2. Lumbar puncture for determination of opening pressure. If pressure is elevated, drain 20 cc. Also obtain cell count, protein, glucose, gram stain, fungal stain, cryptococcal antigen, bacterial culture and fungal culture.  3. BP management 4. SSI  Electronically signed: Dr. Caryl Pina 07/26/2017, 5:18 PM

## 2017-07-27 ENCOUNTER — Inpatient Hospital Stay (HOSPITAL_COMMUNITY): Payer: Medicaid Other

## 2017-07-27 DIAGNOSIS — R0600 Dyspnea, unspecified: Secondary | ICD-10-CM

## 2017-07-27 LAB — GLUCOSE, CAPILLARY
GLUCOSE-CAPILLARY: 174 mg/dL — AB (ref 65–99)
GLUCOSE-CAPILLARY: 173 mg/dL — AB (ref 65–99)
GLUCOSE-CAPILLARY: 253 mg/dL — AB (ref 65–99)
Glucose-Capillary: 148 mg/dL — ABNORMAL HIGH (ref 65–99)
Glucose-Capillary: 243 mg/dL — ABNORMAL HIGH (ref 65–99)
Glucose-Capillary: 261 mg/dL — ABNORMAL HIGH (ref 65–99)

## 2017-07-27 LAB — RAPID URINE DRUG SCREEN, HOSP PERFORMED
Amphetamines: NOT DETECTED
Barbiturates: NOT DETECTED
Benzodiazepines: NOT DETECTED
Cocaine: NOT DETECTED
Opiates: NOT DETECTED
Tetrahydrocannabinol: POSITIVE — AB

## 2017-07-27 LAB — COMPREHENSIVE METABOLIC PANEL
ALBUMIN: 3.4 g/dL — AB (ref 3.5–5.0)
ALK PHOS: 48 U/L (ref 38–126)
ALT: 15 U/L — AB (ref 17–63)
ANION GAP: 10 (ref 5–15)
AST: 15 U/L (ref 15–41)
BUN: 13 mg/dL (ref 6–20)
CO2: 25 mmol/L (ref 22–32)
CREATININE: 1.21 mg/dL (ref 0.61–1.24)
Calcium: 8.9 mg/dL (ref 8.9–10.3)
Chloride: 102 mmol/L (ref 101–111)
GFR calc Af Amer: >60 mL/min (ref >60)
GFR calc non Af Amer: >60 mL/min (ref >60)
GLUCOSE: 202 mg/dL — AB (ref 65–99)
Potassium: 3.1 mmol/L — ABNORMAL LOW (ref 3.5–5.1)
Sodium: 137 mmol/L (ref 135–145)
Total Bilirubin: 0.9 mg/dL (ref 0.3–1.2)
Total Protein: 7.5 g/dL (ref 6.5–8.1)

## 2017-07-27 LAB — URINALYSIS, ROUTINE W REFLEX MICROSCOPIC
BACTERIA UA: NONE SEEN
Bilirubin Urine: NEGATIVE
Glucose, UA: 150 mg/dL — AB
Hgb urine dipstick: NEGATIVE
KETONES UR: NEGATIVE mg/dL
LEUKOCYTES UA: NEGATIVE
Nitrite: NEGATIVE
PROTEIN: 30 mg/dL — AB
SQUAMOUS EPITHELIAL / LPF: NONE SEEN
Specific Gravity, Urine: 1.019 (ref 1.005–1.030)
pH: 6 (ref 5.0–8.0)

## 2017-07-27 LAB — MRSA PCR SCREENING: MRSA BY PCR: NEGATIVE

## 2017-07-27 LAB — HIV ANTIBODY (ROUTINE TESTING W REFLEX): HIV Screen 4th Generation wRfx: NONREACTIVE

## 2017-07-27 MED ORDER — INSULIN ASPART 100 UNIT/ML ~~LOC~~ SOLN
0.0000 [IU] | Freq: Every day | SUBCUTANEOUS | Status: DC
  Administered 2017-07-27: 3 [IU] via SUBCUTANEOUS
  Administered 2017-07-28 – 2017-07-30 (×2): 2 [IU] via SUBCUTANEOUS

## 2017-07-27 MED ORDER — LIDOCAINE HCL (PF) 1 % IJ SOLN
INTRAMUSCULAR | Status: AC
  Administered 2017-07-27: 10 mL
  Filled 2017-07-27: qty 5

## 2017-07-27 MED ORDER — LIDOCAINE HCL (PF) 1 % IJ SOLN
INTRAMUSCULAR | Status: AC
  Filled 2017-07-27: qty 5

## 2017-07-27 MED ORDER — POTASSIUM CHLORIDE 20 MEQ/15ML (10%) PO SOLN
40.0000 meq | Freq: Two times a day (BID) | ORAL | Status: AC
  Administered 2017-07-27 (×2): 40 meq via ORAL
  Filled 2017-07-27 (×2): qty 30

## 2017-07-27 MED ORDER — CLONIDINE HCL 0.1 MG PO TABS
0.1000 mg | ORAL_TABLET | Freq: Three times a day (TID) | ORAL | Status: DC | PRN
  Administered 2017-07-27: 0.1 mg via ORAL
  Filled 2017-07-27 (×2): qty 1

## 2017-07-27 MED ORDER — AMLODIPINE BESYLATE 5 MG PO TABS
5.0000 mg | ORAL_TABLET | Freq: Every day | ORAL | Status: DC
  Administered 2017-07-27 – 2017-07-29 (×3): 5 mg via ORAL
  Filled 2017-07-27 (×3): qty 1

## 2017-07-27 MED ORDER — LISINOPRIL 10 MG PO TABS
10.0000 mg | ORAL_TABLET | Freq: Every day | ORAL | Status: DC
  Administered 2017-07-27 – 2017-07-29 (×3): 10 mg via ORAL
  Filled 2017-07-27 (×3): qty 1

## 2017-07-27 MED ORDER — HYDRALAZINE HCL 20 MG/ML IJ SOLN
10.0000 mg | INTRAMUSCULAR | Status: DC | PRN
  Administered 2017-07-27 – 2017-07-30 (×9): 10 mg via INTRAVENOUS
  Filled 2017-07-27 (×9): qty 1

## 2017-07-27 MED ORDER — INSULIN ASPART 100 UNIT/ML ~~LOC~~ SOLN
0.0000 [IU] | Freq: Three times a day (TID) | SUBCUTANEOUS | Status: DC
  Administered 2017-07-28: 2 [IU] via SUBCUTANEOUS
  Administered 2017-07-28: 3 [IU] via SUBCUTANEOUS
  Administered 2017-07-28 – 2017-07-29 (×2): 2 [IU] via SUBCUTANEOUS
  Administered 2017-07-29: 1 [IU] via SUBCUTANEOUS
  Administered 2017-07-29: 2 [IU] via SUBCUTANEOUS
  Administered 2017-07-30: 1 [IU] via SUBCUTANEOUS
  Administered 2017-07-30: 3 [IU] via SUBCUTANEOUS
  Administered 2017-07-30: 2 [IU] via SUBCUTANEOUS
  Administered 2017-07-31: 3 [IU] via SUBCUTANEOUS
  Administered 2017-07-31: 2 [IU] via SUBCUTANEOUS
  Administered 2017-07-31: 3 [IU] via SUBCUTANEOUS

## 2017-07-27 NOTE — Procedures (Signed)
Name of procedure : Lumbar puncture  Indication: To evaluate for pseudotumor cerebri  Consent: Written informed consent obtained from patient  Anesthesia:  1% lidocaine local anesthesia utilized  Procedure description: Patient placed in the upright. Using sterile technique with standard bony landmarks, a 20 G LP needle introduced into the L3-L4 interspinous space. 2 attempts made by myself and Dr. Otelia LimesLindzen, Neurology. Both attempts were unsuccessful due to obesity and body habitus. Patient tolerated the procedure well without any immediate pain or bleeding complications. Will place IR consult for LP.  Chilton GreathousePraveen Ark Agrusa MD [facility] Pager [phone] If no answer or after 3pm call: [phone] 07/27/2017, 1:58 PM

## 2017-07-27 NOTE — Progress Notes (Signed)
eLink Physician-Brief Progress Note Patient Name: William FlashJoseph Constantino DOB: May 26, 1981 MRN: [id number]   Date of Service  07/27/2017  HPI/Events of Note  Multiple issues:  1. Hyperglycemia - Blood glucose = 253 and 2. Hypertension - BP = 158/102.  eICU Interventions  Will order: 1. Q 4 hour sensitive Novolog SSI. 2. Lisinopril 10 mg  PO Q day. 3. Catapres 0.1 mg PO Q 8 hours PRN.      Intervention Category Major Interventions: Hypertension - evaluation and management;Hyperglycemia - active titration of insulin therapy  Lenell AntuSommer,Steven Eugene 07/27/2017, 7:44 PM

## 2017-07-27 NOTE — Progress Notes (Signed)
  Echocardiogram 2D Echocardiogram has been performed.  William Zuniga, William Zuniga F 07/27/2017, 5:48 PM

## 2017-07-27 NOTE — Progress Notes (Addendum)
[facility] Pulmonary Diseases & Critical Care Medicine HISTORY & PHYSICAL  Patient Name: William Zuniga MRN: [id number] DOB: 10/29/1980    ADMISSION DATE:  07/26/2017 DATE OF SERVICE:  07/26/2017  CHIEF COMPLAINT:  B papilledema   HISTORY OF PRESENT ILLNESS  This 36 y.o. 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 The patient has been experiencing rapidly progressive deterioration of his visual acuity over the past several months. He does not discern light and dark at this time. He used to have normal vision and was able to drive. 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 The patient's wife is at the bedside. She reports that she can detect that the patient's blood pressure is elevated due to changes in personality and behavior. In the ER, the patient has apparently "required" Cardene gtt for blood pressure control, prompting request to admit to ICU.  He has had hypertension for many years. He admits to nonadherence to prescribed medications, citing lack of health insurance coverage. He also claims to be diabetic, which he thinks was first diagnosed at age 10 or 15. He was briefly on insulin but says that he was "able to get off of insulin".  Interim history: Overnight he continues on Cardene drip.  Blood pressure is better.  No new complaints.  REVIEW OF SYSTEMS Constitutional: No weight loss. No night sweats. No fever. No chills. No fatigue. HEENT: No headaches, dysphagia, sore throat, otalgia, nasal congestion, PND CV:  chest pain. No orthopnea, PND, swelling in lower extremities, palpitations GI:  No abdominal pain, nausea, vomiting, diarrhea, change in bowel pattern, anorexia Resp: DOE. No cough, mucus, hemoptysis, wheezing  GU: no dysuria,  change in color of urine, no urgency or frequency.  No flank pain. MS:  No joint pain or swelling. No myalgias,  No decreased range of motion.  Psych:  No change in mood or affect. No memory loss. Skin: no rash or lesions.   PAST MEDICAL/SURGICAL/SOCIAL/FAMILY HISTORIES   Past Medical History:  Diagnosis Date  . Diabetes mellitus without complication (HCC)   . Hypertension     History reviewed. No pertinent surgical history.  Social History   Tobacco Use  . Smoking status: Never Smoker  . Smokeless tobacco: Never Used  Substance Use Topics  . Alcohol use: No    Frequency: Never    History reviewed. No pertinent family history.   No Known Allergies   Prior to Admission medications   Medication Sig Start Date End Date Taking? Authorizing Provider  amLODipine (NORVASC) 5 MG tablet Take 1 tablet (5 mg total) by mouth daily. 06/05/17  Yes Sharman Cheek, MD  ondansetron (ZOFRAN ODT) 4 MG disintegrating tablet Take 1 tablet (4 mg total) by mouth every 8 (eight) hours as needed for nausea or vomiting. Patient not taking: Reported on 07/26/2017 06/05/17   Sharman Cheek, MD    Current Facility-Administered Medications  Medication Dose Route Frequency Provider Last Rate Last Dose  . amLODipine (NORVASC) tablet 5 mg  5 mg Oral Daily Joneric Streight, MD      . hydrALAZINE (APRESOLINE) injection 10 mg  10 mg Intravenous Q4H PRN Asah Lamay, MD      . nicardipine (CARDENE) 20mg  in 0.86% saline IV infusion (0.1 mg/ml)  3-15 mg/hr Intravenous Continuous Marcelle Smiling, MD  Stopped at 07/27/17 0757     VITAL SIGNS: BP 122/81   Pulse 85   Temp 98.1 F (36.7 C) (Oral)   Resp (!) 26   Ht 5\' 9"  (1.753 m)   Wt 277 lb 12.5 oz (126 kg)   SpO2 90%   BMI 41.02 kg/m   HEMODYNAMICS:    VENTILATOR SETTINGS: FiO2 (%):  [30 %] 30 %  INTAKE / OUTPUT: I/O last 3 completed shifts: In: 862.3 [I.V.:862.3] Out: 925 [Urine:925]  PHYSICAL EXAMINATION: Blood pressure  122/81, pulse 85, temperature 98.1 F (36.7 C), temperature source Oral, resp. rate (!) 26, height 5\' 9"  (1.753 m), weight 277 lb 12.5 oz (126 kg), SpO2 90 %. Gen:      No acute distress HEENT:  EOMI, sclera anicteric Neck:     No masses; no thyromegaly Lungs:    Clear to auscultation bilaterally; normal respiratory effort CV:         Regular rate and rhythm; no murmurs Abd:      + bowel sounds; soft, non-tender; no palpable masses, no distension Ext:    No edema; adequate peripheral perfusion Skin:      Warm and dry; no rash Neuro: alert and oriented x 3 Psych: normal mood and affect  LABS:  BMET Recent Labs  Lab 07/26/17 1222 07/27/17 0626  NA 137 137  K 3.5 3.1*  CL 101 102  CO2 25 25  BUN 11 13  CREATININE 1.27* 1.21  GLUCOSE 255* 202*    Electrolytes Recent Labs  Lab 07/26/17 1222 07/27/17 0626  CALCIUM 9.3 8.9  MG 1.9  --     CBC Recent Labs  Lab 07/26/17 1222  WBC 7.8  HGB 14.9  HCT 43.5  PLT 317    Coag's No results for input(s): APTT, INR in the last 168 hours.  Sepsis Markers No results for input(s): LATICACIDVEN, PROCALCITON, O2SATVEN in the last 168 hours.  ABG No results for input(s): PHART, PCO2ART, PO2ART in the last 168 hours.  Liver Enzymes Recent Labs  Lab 07/27/17 0626  AST 15  ALT 15*  ALKPHOS 48  BILITOT 0.9  ALBUMIN 3.4*    Cardiac Enzymes No results for input(s): TROPONINI, PROBNP in the last 168 hours.  Glucose Recent Labs  Lab 07/26/17 2120 07/26/17 2307 07/27/17 0331 07/27/17 0741  GLUCAP 148* 200* 174* 173*    Imaging Dg Chest 2 View  Result Date: 07/26/2017 CLINICAL DATA:  Hypertensive crisis. Occasional shortness of breath and chest pain. EXAM: CHEST  2 VIEW COMPARISON:  None. FINDINGS: Cardiomegaly and pulmonary vascular congestion noted. There is no evidence of focal airspace disease, pulmonary edema, suspicious pulmonary nodule/mass, pleural effusion, or pneumothorax. No acute bony abnormalities are  identified. IMPRESSION: Cardiomegaly with pulmonary vascular congestion. Electronically Signed   By: Harmon Pier M.D.   On: 07/26/2017 12:56   Ct Head Wo Contrast  Result Date: 07/26/2017 CLINICAL DATA:  Neuro deficits, subacute. Bilateral visual changes. Hypertensive crisis. Disc edema. EXAM: CT HEAD WITHOUT CONTRAST TECHNIQUE: Contiguous axial images were obtained from the base of the skull through the vertex without intravenous contrast. COMPARISON:  None. FINDINGS: Brain: Bilateral subcortical white matter hypoattenuation is advanced for age. This is predominantly in the frontal lobes. No acute cortical infarct, hemorrhage, or mass lesion is present. Ventricles are of normal size. Basal ganglia are intact. The brainstem and cerebellum are normal. Vascular: No hyperdense vessel or unexpected calcification. Skull: Calvarium is intact. No focal lytic or blastic lesion is present. Sinuses/Orbits: The  paranasal sinuses and mastoid air cells are clear. The globes and orbits are within normal limits. No mass lesion is present. Cavernous sinus is normal bilaterally. Sella is unremarkable. IMPRESSION: 1. Bilateral subcortical white matter hypoattenuation is moderately advanced for age. The pattern is more anterior than posterior, atypical for posterior reversible encephalopathy syndrome. This raises concern for chronic underlying microvascular disease. 2. No acute cortical defect. 3. No evidence for hemorrhage. Electronically Signed   By: Marin Roberts M.D.   On: 07/26/2017 15:30   Mr Laqueta Jean ZO Contrast  Result Date: 07/26/2017 CLINICAL DATA:  Vision changes since December. Hypertensive crisis with papilledema and blindness. EXAM: MRI HEAD WITHOUT AND WITH CONTRAST MRI ORBITS WITH AND WITHOUT CONTRAST MRI CERVICAL SPINE WITHOUT AND WITH CONTRAST TECHNIQUE: Multiplanar, multiecho pulse sequences of the brain, orbits and cervical spine and surrounding structures were obtained without and with intravenous  contrast. CONTRAST:  20mL MULTIHANCE GADOBENATE DIMEGLUMINE 529 MG/ML IV SOLN COMPARISON:  CT HEAD July 26, 2017 at 1520 hours FINDINGS: MRI HEAD FINDINGS INTRACRANIAL CONTENTS: No reduced diffusion to suggest acute ischemia or hyperacute demyelination. No susceptibility artifact to suggest hemorrhage. Mildly prominent for patient's age. Patchy to confluent supratentorial white matter FLAIR T2 hyperintensities, many of which radiate from the periventricular margin with decreased T1 signal. No midline shift, mass effect or masses. No intraparenchymal or extra-axial abnormal enhancement. No extra-axial fluid collections or masses. VASCULAR: Normal major intracranial vascular flow voids present at skull base. Though not tailored for evaluation, no evidence of dural venous sinus thrombosis. SKULL AND UPPER CERVICAL SPINE: No abnormal sellar expansion. No suspicious calvarial bone marrow signal. Craniocervical junction maintained. OTHER: None. MRI ORBITS FINDINGS-mild motion degraded examination. ORBITS: Slight flattening of the optic discs bilaterally. Slightly patulous optic sheath and surrounding edema. No abnormal optic nerve signal or enhancement. Ocular globes are intact with normal signal. Lenses are located. Preservation of the extra conal fat. Normal symmetric appearance of the extraocular muscles. No intra-ocular mass, signal abnormality nor abnormal enhancement. Superior ophthalmic veins are not enlarged. VISUALIZED SINUSES: Well-aerated. SOFT TISSUES: Normal. MRI CERVICAL SPINE FINDINGS-Mild motion degraded examination. ALIGNMENT: Straightened cervical lordosis.  No malalignment. VERTEBRAE/DISCS: Vertebral bodies are intact. Intervertebral disc morphology's and signal are normal. Mild chronic discogenic endplate changes C5-6 and C6-7. No acute or abnormal bone marrow signal. No abnormal osseous or disc enhancement. CORD:Cervical spinal cord is normal morphology and signal characteristics from the  cervicomedullary junction to level of T1-2, the most caudal well visualized level. No abnormal cord, leptomeningeal or epidural enhancement. POSTERIOR FOSSA, VERTEBRAL ARTERIES, PARASPINAL TISSUES: No MR findings of ligamentous injury. Vertebral artery flow voids present. Included posterior fossa and paraspinal soft tissues are normal. DISC LEVELS: C2-3: No disc bulge, canal stenosis nor neural foraminal narrowing. C3-4: Uncovertebral hypertrophy toward the RIGHT. No canal stenosis. Moderate RIGHT neural foraminal narrowing. C4-5: Uncovertebral hypertrophy without canal stenosis. Moderate RIGHT neural foraminal narrowing. Mild LEFT neural foraminal narrowing. C5-6: Small central disc protrusion, uncovertebral hypertrophy. No canal stenosis. Mild to moderate RIGHT, mild LEFT neural foraminal narrowing. C6-7: Annular bulging. No canal stenosis. No neural foraminal narrowing. C7-T1: No disc bulge, canal stenosis nor neural foraminal narrowing. IMPRESSION: MRI HEAD: 1. No acute intracranial process. 2. Moderate white matter changes favoring acute ischemic disease, there could be a component of chronic demyelination. 3. Borderline parenchymal brain volume loss for age. MRI ORBITS: 1. Mildly patulous optic nerve sheath complexes with flattened edema seen with intracranial hypertension. 2. No MR findings of optic neuritis. MRI CERVICAL SPINE: 1. No  MR findings of demyelination or acute process in the cervical spine. 2. Degenerative changes cervical spine without canal stenosis. Neural foraminal narrowing C3-4 through C5-6: Moderate on the RIGHT at C3-4 and C4-5 (possibly overestimated on 3 tesla scanner). Electronically Signed   By: Awilda Metro M.D.   On: 07/26/2017 22:16   Mr Cervical Spine W Wo Contrast  Result Date: 07/26/2017 CLINICAL DATA:  Vision changes since December. Hypertensive crisis with papilledema and blindness. EXAM: MRI HEAD WITHOUT AND WITH CONTRAST MRI ORBITS WITH AND WITHOUT CONTRAST MRI  CERVICAL SPINE WITHOUT AND WITH CONTRAST TECHNIQUE: Multiplanar, multiecho pulse sequences of the brain, orbits and cervical spine and surrounding structures were obtained without and with intravenous contrast. CONTRAST:  20mL MULTIHANCE GADOBENATE DIMEGLUMINE 529 MG/ML IV SOLN COMPARISON:  CT HEAD July 26, 2017 at 1520 hours FINDINGS: MRI HEAD FINDINGS INTRACRANIAL CONTENTS: No reduced diffusion to suggest acute ischemia or hyperacute demyelination. No susceptibility artifact to suggest hemorrhage. Mildly prominent for patient's age. Patchy to confluent supratentorial white matter FLAIR T2 hyperintensities, many of which radiate from the periventricular margin with decreased T1 signal. No midline shift, mass effect or masses. No intraparenchymal or extra-axial abnormal enhancement. No extra-axial fluid collections or masses. VASCULAR: Normal major intracranial vascular flow voids present at skull base. Though not tailored for evaluation, no evidence of dural venous sinus thrombosis. SKULL AND UPPER CERVICAL SPINE: No abnormal sellar expansion. No suspicious calvarial bone marrow signal. Craniocervical junction maintained. OTHER: None. MRI ORBITS FINDINGS-mild motion degraded examination. ORBITS: Slight flattening of the optic discs bilaterally. Slightly patulous optic sheath and surrounding edema. No abnormal optic nerve signal or enhancement. Ocular globes are intact with normal signal. Lenses are located. Preservation of the extra conal fat. Normal symmetric appearance of the extraocular muscles. No intra-ocular mass, signal abnormality nor abnormal enhancement. Superior ophthalmic veins are not enlarged. VISUALIZED SINUSES: Well-aerated. SOFT TISSUES: Normal. MRI CERVICAL SPINE FINDINGS-Mild motion degraded examination. ALIGNMENT: Straightened cervical lordosis.  No malalignment. VERTEBRAE/DISCS: Vertebral bodies are intact. Intervertebral disc morphology's and signal are normal. Mild chronic discogenic  endplate changes C5-6 and C6-7. No acute or abnormal bone marrow signal. No abnormal osseous or disc enhancement. CORD:Cervical spinal cord is normal morphology and signal characteristics from the cervicomedullary junction to level of T1-2, the most caudal well visualized level. No abnormal cord, leptomeningeal or epidural enhancement. POSTERIOR FOSSA, VERTEBRAL ARTERIES, PARASPINAL TISSUES: No MR findings of ligamentous injury. Vertebral artery flow voids present. Included posterior fossa and paraspinal soft tissues are normal. DISC LEVELS: C2-3: No disc bulge, canal stenosis nor neural foraminal narrowing. C3-4: Uncovertebral hypertrophy toward the RIGHT. No canal stenosis. Moderate RIGHT neural foraminal narrowing. C4-5: Uncovertebral hypertrophy without canal stenosis. Moderate RIGHT neural foraminal narrowing. Mild LEFT neural foraminal narrowing. C5-6: Small central disc protrusion, uncovertebral hypertrophy. No canal stenosis. Mild to moderate RIGHT, mild LEFT neural foraminal narrowing. C6-7: Annular bulging. No canal stenosis. No neural foraminal narrowing. C7-T1: No disc bulge, canal stenosis nor neural foraminal narrowing. IMPRESSION: MRI HEAD: 1. No acute intracranial process. 2. Moderate white matter changes favoring acute ischemic disease, there could be a component of chronic demyelination. 3. Borderline parenchymal brain volume loss for age. MRI ORBITS: 1. Mildly patulous optic nerve sheath complexes with flattened edema seen with intracranial hypertension. 2. No MR findings of optic neuritis. MRI CERVICAL SPINE: 1. No MR findings of demyelination or acute process in the cervical spine. 2. Degenerative changes cervical spine without canal stenosis. Neural foraminal narrowing C3-4 through C5-6: Moderate on the RIGHT at C3-4 and  C4-5 (possibly overestimated on 3 tesla scanner). Electronically Signed   By: Awilda Metro M.D.   On: 07/26/2017 22:16   Mr Rockwell Germany ZO Contrast  Result Date:  07/26/2017 CLINICAL DATA:  Vision changes since December. Hypertensive crisis with papilledema and blindness. EXAM: MRI HEAD WITHOUT AND WITH CONTRAST MRI ORBITS WITH AND WITHOUT CONTRAST MRI CERVICAL SPINE WITHOUT AND WITH CONTRAST TECHNIQUE: Multiplanar, multiecho pulse sequences of the brain, orbits and cervical spine and surrounding structures were obtained without and with intravenous contrast. CONTRAST:  20mL MULTIHANCE GADOBENATE DIMEGLUMINE 529 MG/ML IV SOLN COMPARISON:  CT HEAD July 26, 2017 at 1520 hours FINDINGS: MRI HEAD FINDINGS INTRACRANIAL CONTENTS: No reduced diffusion to suggest acute ischemia or hyperacute demyelination. No susceptibility artifact to suggest hemorrhage. Mildly prominent for patient's age. Patchy to confluent supratentorial white matter FLAIR T2 hyperintensities, many of which radiate from the periventricular margin with decreased T1 signal. No midline shift, mass effect or masses. No intraparenchymal or extra-axial abnormal enhancement. No extra-axial fluid collections or masses. VASCULAR: Normal major intracranial vascular flow voids present at skull base. Though not tailored for evaluation, no evidence of dural venous sinus thrombosis. SKULL AND UPPER CERVICAL SPINE: No abnormal sellar expansion. No suspicious calvarial bone marrow signal. Craniocervical junction maintained. OTHER: None. MRI ORBITS FINDINGS-mild motion degraded examination. ORBITS: Slight flattening of the optic discs bilaterally. Slightly patulous optic sheath and surrounding edema. No abnormal optic nerve signal or enhancement. Ocular globes are intact with normal signal. Lenses are located. Preservation of the extra conal fat. Normal symmetric appearance of the extraocular muscles. No intra-ocular mass, signal abnormality nor abnormal enhancement. Superior ophthalmic veins are not enlarged. VISUALIZED SINUSES: Well-aerated. SOFT TISSUES: Normal. MRI CERVICAL SPINE FINDINGS-Mild motion degraded examination.  ALIGNMENT: Straightened cervical lordosis.  No malalignment. VERTEBRAE/DISCS: Vertebral bodies are intact. Intervertebral disc morphology's and signal are normal. Mild chronic discogenic endplate changes C5-6 and C6-7. No acute or abnormal bone marrow signal. No abnormal osseous or disc enhancement. CORD:Cervical spinal cord is normal morphology and signal characteristics from the cervicomedullary junction to level of T1-2, the most caudal well visualized level. No abnormal cord, leptomeningeal or epidural enhancement. POSTERIOR FOSSA, VERTEBRAL ARTERIES, PARASPINAL TISSUES: No MR findings of ligamentous injury. Vertebral artery flow voids present. Included posterior fossa and paraspinal soft tissues are normal. DISC LEVELS: C2-3: No disc bulge, canal stenosis nor neural foraminal narrowing. C3-4: Uncovertebral hypertrophy toward the RIGHT. No canal stenosis. Moderate RIGHT neural foraminal narrowing. C4-5: Uncovertebral hypertrophy without canal stenosis. Moderate RIGHT neural foraminal narrowing. Mild LEFT neural foraminal narrowing. C5-6: Small central disc protrusion, uncovertebral hypertrophy. No canal stenosis. Mild to moderate RIGHT, mild LEFT neural foraminal narrowing. C6-7: Annular bulging. No canal stenosis. No neural foraminal narrowing. C7-T1: No disc bulge, canal stenosis nor neural foraminal narrowing. IMPRESSION: MRI HEAD: 1. No acute intracranial process. 2. Moderate white matter changes favoring acute ischemic disease, there could be a component of chronic demyelination. 3. Borderline parenchymal brain volume loss for age. MRI ORBITS: 1. Mildly patulous optic nerve sheath complexes with flattened edema seen with intracranial hypertension. 2. No MR findings of optic neuritis. MRI CERVICAL SPINE: 1. No MR findings of demyelination or acute process in the cervical spine. 2. Degenerative changes cervical spine without canal stenosis. Neural foraminal narrowing C3-4 through C5-6: Moderate on the RIGHT  at C3-4 and C4-5 (possibly overestimated on 3 tesla scanner). Electronically Signed   By: Awilda Metro M.D.   On: 07/26/2017 22:16    ASSESSMENT / PLAN: 37 year old with hypertensive crisis, papilledema.  Possible PRES syndrome  Hypertensive emergency Start norvasc, hydralazine prn. Wean off Cardene Check echo  Diabetes Not on insulin as outpatient Start SSI coverage Follow Hb A1c  Suspected sleep apnea. Will need sleep study as outpatient   Papilledema, vision loss. ? PRES Appreciate neurology recommendation. MRI brain results noted with no acute process  Chilton GreathousePraveen Adaysha Dubinsky MD [facility] Pager [phone] If no answer or after 3pm call: [phone] 07/27/2017, 8:08 AM

## 2017-07-27 NOTE — Progress Notes (Addendum)
Interim history: Pt is off nicardipine drip. BP is stable Ok for transfer out of ICU Bedside LP was unsucessful (see note)  Discussed with radiology. They will do fluoro guided LP on Monday. He is scheduled for AM  Chilton GreathousePraveen Adetokunbo Mccadden MD [facility] Pager [phone] If no answer or after 3pm call: [phone] 07/27/2017, 1:53 PM

## 2017-07-28 ENCOUNTER — Inpatient Hospital Stay (HOSPITAL_COMMUNITY): Payer: Medicaid Other

## 2017-07-28 DIAGNOSIS — E876 Hypokalemia: Secondary | ICD-10-CM

## 2017-07-28 LAB — GLUCOSE, CAPILLARY
GLUCOSE-CAPILLARY: 186 mg/dL — AB (ref 65–99)
GLUCOSE-CAPILLARY: 232 mg/dL — AB (ref 65–99)
Glucose-Capillary: 164 mg/dL — ABNORMAL HIGH (ref 65–99)
Glucose-Capillary: 216 mg/dL — ABNORMAL HIGH (ref 65–99)

## 2017-07-28 LAB — BASIC METABOLIC PANEL
ANION GAP: 11 (ref 5–15)
BUN: 11 mg/dL (ref 6–20)
CALCIUM: 9.1 mg/dL (ref 8.9–10.3)
CO2: 23 mmol/L (ref 22–32)
Chloride: 104 mmol/L (ref 101–111)
Creatinine, Ser: 1.13 mg/dL (ref 0.61–1.24)
GFR calc Af Amer: >60 mL/min (ref >60)
GLUCOSE: 162 mg/dL — AB (ref 65–99)
POTASSIUM: 3.4 mmol/L — AB (ref 3.5–5.1)
SODIUM: 138 mmol/L (ref 135–145)

## 2017-07-28 LAB — ECHOCARDIOGRAM COMPLETE
HEIGHTINCHES: 69 in
Weight: 4444.47 oz

## 2017-07-28 LAB — BLOOD GAS, ARTERIAL
Acid-Base Excess: 1.5 mmol/L (ref 0.0–2.0)
BICARBONATE: 25.2 mmol/L (ref 20.0–28.0)
Drawn by: 36496
FIO2: 21
O2 Saturation: 94.4 %
PATIENT TEMPERATURE: 98.2
PO2 ART: 70.5 mmHg — AB (ref 83.0–108.0)
pCO2 arterial: 36.5 mmHg (ref 32.0–48.0)
pH, Arterial: 7.452 — ABNORMAL HIGH (ref 7.350–7.450)

## 2017-07-28 LAB — CBC
HCT: 41 % (ref 39.0–52.0)
Hemoglobin: 14.2 g/dL (ref 13.0–17.0)
MCH: 30.8 pg (ref 26.0–34.0)
MCHC: 34.6 g/dL (ref 30.0–36.0)
MCV: 88.9 fL (ref 78.0–100.0)
Platelets: 305 10*3/uL (ref 150–400)
RBC: 4.61 MIL/uL (ref 4.22–5.81)
RDW: 13.6 % (ref 11.5–15.5)
WBC: 9 10*3/uL (ref 4.0–10.5)

## 2017-07-28 LAB — PHOSPHORUS: Phosphorus: 3.2 mg/dL (ref 2.5–4.6)

## 2017-07-28 LAB — MAGNESIUM: Magnesium: 1.9 mg/dL (ref 1.7–2.4)

## 2017-07-28 MED ORDER — MAGNESIUM SULFATE IN D5W 1-5 GM/100ML-% IV SOLN
1.0000 g | Freq: Once | INTRAVENOUS | Status: AC
  Administered 2017-07-28: 1 g via INTRAVENOUS
  Filled 2017-07-28: qty 100

## 2017-07-28 MED ORDER — POTASSIUM CHLORIDE CRYS ER 20 MEQ PO TBCR
40.0000 meq | EXTENDED_RELEASE_TABLET | Freq: Once | ORAL | Status: AC
  Administered 2017-07-28: 40 meq via ORAL
  Filled 2017-07-28: qty 2

## 2017-07-28 MED ORDER — IBUPROFEN 600 MG PO TABS
600.0000 mg | ORAL_TABLET | Freq: Four times a day (QID) | ORAL | Status: DC | PRN
  Administered 2017-07-28 – 2017-07-29 (×2): 600 mg via ORAL
  Filled 2017-07-28 (×4): qty 3

## 2017-07-28 MED ORDER — ACETAMINOPHEN 500 MG PO TABS: 1000.0000 mg | ORAL_TABLET | Freq: Four times a day (QID) | ORAL | Status: DC | PRN

## 2017-07-28 NOTE — Progress Notes (Signed)
   Subjective: [name] says vision is not improved from the time of admission. He denies chest pain, difficulty breathing, or any other new concerns. He is getting restless. His girlfriend ask when they will be able to leave the hospital. We discussed the acuity of this admission and that discharge cannot be today. They ask if he will ever regain his vision.    Objective:  Vital signs in last 24 hours: Vitals:   07/28/17 0400 07/28/17 0600 07/28/17 0700 07/28/17 0727  BP: 136/70 (!) 159/95 (!) 138/94   Pulse: 68 77 79   Resp: 12 (!) 23 (!) 23   Temp:    98.2 F (36.8 C)  TempSrc:    Oral  SpO2: 95% 95% 91%   Weight:      Height:       General: well appearing, no acute distress  Cardiac: RRR, no murmur appreciated  Pulm: lungs clear to auscultation, no respiratory distress  GI: BS+, soft, non tender, non distended   Assessment/Plan:    Hypertensive crisis   PRES (posterior reversible encephalopathy syndrome)   Papilledema Admit 1/18 with BP 200/129 at presentation with concern for severe worsening of vision over the past few months and had been sent to the ED from his opthomologist office where he was found to have bl papilledemaand swelling of the optic nerve. CT scan showed white matter changes throughout bilateral anterior cerebral cortex. Started on nicardipine drip and admit  to the ICU. Neuro was consulted and had concern for Pseudotumor cerebri and cryptococcal meningitison the day of admission. Bedside LP attempted yesterday but unsuccessful. Weaned off cardene yesterday and BP 138/94 this morning, still requiring IV hydralizine overnight twice overnight.  - Radiology planning for fluro guided LP Monday, opening pressure will be measured and fluid will be sent for cell count, protein, glucose, gram stain, fungal stain, cryptococcal antigen, and bacterial and fungal culture.  - amlodipine 5 mg qd, Lisinopril 10 mg qd and clonidine 0.1 mg q8h started yesterday  - continue  hydralazine PRN  - Neurology following, appreciate their help with his care   Type 2 Diabetes  A1c 8.9 on admission, says that he previously was on insulin but stopped. BG 140-180 over past 24 hours.  - continue SSI  - Continue CBG monitoring  - FU lipid panel for stratification of ASCVD risk  - carb mod diet   Hypokalemia, Hypomagnesemia  K 3.4 this morning, Mag 1.9.  - Replacing with 40 meq K-dur and mag 1g   Dispo  - SW consulted for assistance with new medications   Dispo: Anticipated discharge in approximately 1-2 day(s).   William Zuniga, William Gladwin, MD 07/28/2017, 9:41 AM Pager: [phone]

## 2017-07-29 ENCOUNTER — Inpatient Hospital Stay (HOSPITAL_COMMUNITY): Payer: Medicaid Other

## 2017-07-29 LAB — LIPID PANEL
CHOLESTEROL: 205 mg/dL — AB (ref 0–200)
HDL: 28 mg/dL — AB (ref >40)
LDL Cholesterol: 143 mg/dL — ABNORMAL HIGH (ref 0–99)
Total CHOL/HDL Ratio: 7.3 RATIO
Triglycerides: 168 mg/dL — ABNORMAL HIGH (ref <150)
VLDL: 34 mg/dL (ref 0–40)

## 2017-07-29 LAB — GLUCOSE, CAPILLARY
Glucose-Capillary: 148 mg/dL — ABNORMAL HIGH (ref 65–99)
Glucose-Capillary: 172 mg/dL — ABNORMAL HIGH (ref 65–99)
Glucose-Capillary: 173 mg/dL — ABNORMAL HIGH (ref 65–99)
Glucose-Capillary: 115 mg/dL — ABNORMAL HIGH (ref 65–99)

## 2017-07-29 LAB — CSF CELL COUNT WITH DIFFERENTIAL
RBC Count, CSF: 52 /mm3 — ABNORMAL HIGH
Tube #: 3
WBC CSF: 2 /mm3 (ref 0–5)

## 2017-07-29 LAB — BASIC METABOLIC PANEL
Anion gap: 11 (ref 5–15)
BUN: 14 mg/dL (ref 6–20)
CHLORIDE: 105 mmol/L (ref 101–111)
CO2: 22 mmol/L (ref 22–32)
Calcium: 9.4 mg/dL (ref 8.9–10.3)
Creatinine, Ser: 1.27 mg/dL — ABNORMAL HIGH (ref 0.61–1.24)
GFR calc Af Amer: >60 mL/min (ref >60)
GFR calc non Af Amer: >60 mL/min (ref >60)
GLUCOSE: 151 mg/dL — AB (ref 65–99)
POTASSIUM: 3.5 mmol/L (ref 3.5–5.1)
Sodium: 138 mmol/L (ref 135–145)

## 2017-07-29 LAB — GLUCOSE, CSF: GLUCOSE CSF: 92 mg/dL — AB (ref 40–70)

## 2017-07-29 LAB — PROTEIN, CSF: Total  Protein, CSF: 29 mg/dL (ref 15–45)

## 2017-07-29 LAB — CRYPTOCOCCAL ANTIGEN, CSF: CRYPTO AG: NEGATIVE

## 2017-07-29 LAB — VITAMIN B12: Vitamin B-12: 398 pg/mL (ref 180–914)

## 2017-07-29 MED ORDER — LIDOCAINE HCL (PF) 1 % IJ SOLN
INTRAMUSCULAR | Status: AC
  Administered 2017-07-29: 5 mL via INTRADERMAL
  Filled 2017-07-29: qty 5

## 2017-07-29 MED ORDER — HYDROCHLOROTHIAZIDE 12.5 MG PO CAPS
12.5000 mg | ORAL_CAPSULE | Freq: Every day | ORAL | Status: DC
  Filled 2017-07-29: qty 1

## 2017-07-29 MED ORDER — LABETALOL HCL 5 MG/ML IV SOLN
INTRAVENOUS | Status: AC
  Filled 2017-07-29: qty 4

## 2017-07-29 MED ORDER — LIDOCAINE HCL (PF) 1 % IJ SOLN
5.0000 mL | Freq: Once | INTRAMUSCULAR | Status: AC
  Administered 2017-07-29: 5 mL via INTRADERMAL

## 2017-07-29 MED ORDER — ACETAZOLAMIDE 250 MG PO TABS
500.0000 mg | ORAL_TABLET | Freq: Two times a day (BID) | ORAL | Status: DC
  Administered 2017-07-29 – 2017-07-31 (×5): 500 mg via ORAL
  Filled 2017-07-29 (×7): qty 2

## 2017-07-29 MED ORDER — LABETALOL HCL 5 MG/ML IV SOLN
10.0000 mg | Freq: Once | INTRAVENOUS | Status: AC
  Administered 2017-07-29: 10 mg via INTRAVENOUS

## 2017-07-29 MED ORDER — HYDROCHLOROTHIAZIDE 25 MG PO TABS
25.0000 mg | ORAL_TABLET | Freq: Every day | ORAL | Status: DC
  Administered 2017-07-29 – 2017-07-30 (×2): 25 mg via ORAL
  Filled 2017-07-29 (×2): qty 1

## 2017-07-29 MED ORDER — AMLODIPINE BESYLATE 10 MG PO TABS
10.0000 mg | ORAL_TABLET | Freq: Every day | ORAL | Status: DC
  Administered 2017-07-30 – 2017-07-31 (×2): 10 mg via ORAL
  Filled 2017-07-29 (×2): qty 1

## 2017-07-29 NOTE — Progress Notes (Addendum)
CSW acknowledges consult for further assistance with medication. Please consult RNCM for further needs with medication as well as getting pt set up with clinic needs. At this time there are no further CSW needs. CSW signing off.    Claude MangesKierra S. Briza Bark, MSW, LCSW-A Emergency Department Clinical Social Worker [phone]

## 2017-07-29 NOTE — Progress Notes (Signed)
   Subjective:  Patient require 2 doses of IV hydralazine overnight, otherwise no acute events. This morning, he reports mild improvement in vision after LP. Denies chest pain and shortness of breath. Discuss need for further BP and DM medication adjustments. Patient voiced understanding and is in agreement with plan. All questions answered.   Objective:  Vital signs in last 24 hours: Vitals:   07/29/17 0723 07/29/17 0911 07/29/17 0928 07/29/17 1222  BP:  (!) 162/109 (!) 165/110   Pulse:      Resp:      Temp: 98.1 F (36.7 C)   98.2 F (36.8 C)  TempSrc: Oral   Oral  SpO2:      Weight:      Height:       Physical Exam  Constitutional: He is oriented to person, place, and time and well-developed, well-nourished, and in no distress. No distress.  Cardiovascular: Normal rate, regular rhythm and normal heart sounds. Exam reveals no gallop and no friction rub.  No murmur heard. Pulmonary/Chest: Effort normal and breath sounds normal. No respiratory distress. He has no wheezes. He has no rales.  Abdominal: Soft. Bowel sounds are normal. He exhibits no distension. There is no tenderness. There is no rebound.  Musculoskeletal: Normal range of motion. He exhibits no edema.  Neurological: He is alert and oriented to person, place, and time.  Vision improving and now able to distinguish details. No other focal deficits noted   Skin: Skin is warm. He is not diaphoretic.   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 CT scan showed white matter changes throughout bilateral anterior cerebral cortex. Started on nicardipine drip and admit  to the ICU. Neuro was consulted and had concern for Pseudotumor cerebri and cryptococcal meningitis on the day of admission. Fluoro-guided LP with opening pressure of 72. Will follow up CSF studies and continue BP medication optimization as he continues to require PRN doses of IV hydralazine.  - Neurology following, appreciate recommendations  - Follow up CSF cell count, protein, glucose, gram stain, fungal stain, cryptococcal antigen, and bacterial and fungal culture.  - Start HCTZ 25 mg QD  - Amlodipine 5--> 10 mg QD  - Continue lisinopril 10 mg QD. Will not increase at this time given mild AKI  - Discontinue clonidine 0.1 TID as likely he will be non-compliant with this once he is discharged   - Continue hydralazine PRN - Will need to establish with PCP   # T2DM: uncontrolled, A1c 8.491/19/019. Reports he was previously on insulin but stopped. CBGs 140-172 in the past 24h.  - continue SSI-S + HS coverage  - Continue CBG monitoring  - CM diet  - Will need PO hypoglycemic agents on discharge. Would not consider insulin since unlikely he will be compliant with this once discharged.   # Hypokalemia, Hypomagnesemia: monitoring    Dispo: Anticipated discharge in approximately 1-3 day(s) pending BP medication  optimization and DM management.   Burna CashSantos-Sanchez, Daphanie Oquendo, MD 07/29/2017, 1:16 PM Pager: [phone]

## 2017-07-29 NOTE — Progress Notes (Signed)
Subjective: Patient in bed, denies eye pain and visual pain with extraocular movement, but admits to photophobia. Although vision has improved from the previous day, he continues to have color desaturation - unable to tell th ecolor of some object in room as well as continued peripheral central vision loss- which he describes as "blurred".  Pt doesn't know his family hx, due to being in foster care.  He endorses theses symptoms have been persistently progressive over the last few months. Exam: Vitals:   07/29/17 0911 07/29/17 0928  BP: (!) 162/109 (!) 165/110  Pulse:    Resp:    Temp:    SpO2:      Physical Exam   HEENT-  Normocephalic, no lesions, without obvious abnormality.  Normal external eye and conjunctiva.   Cardiovascular- tachycardic rhythm, S1-S2 audible, pulses palpable throughout   Lungs-no increased work of breathing, no audible wheezes Abdomen- All 4 quadrants palpated and nontender Extremities- Warm, dry and intact Musculoskeletal-no joint tenderness, deformity or swelling Skin-warm and dry, no hyperpigmentation, vitiligo, or suspicious lesions    Neuro:  Neurological Examination Mental Status: Alert, oriented, thought content appropriate.  Speech fluent without evidence of aphasia.  Able to follow all commands without difficulty. Cranial Nerves: II: Able to count fingers in bilateral visual field except for peripheral field, worse on the  left,  Left central and peripheral vision impaired. Pt able to see object, but unable to see the detail. Blunted margins in optic disk. Pupils 5 mm bilaterally and constricting to 3, with  Right afferent defect in dim light.  III,IV, VI: EOMI without nystagmus. No ptosis.  V,VII: No facial droop. Facial temp sensation normal bilaterally VIII: Hearing intact to conversation IX,X: Palate rises symmetrically XI: Symmetric XII: midline tongue extension Motor: Right :  Upper extremity   5/5                                      Left:      Upper extremity   5/5             Lower extremity   5/5                                                  Lower extremity   5/5 Normal tone throughout; no atrophy noted Sensory: Temp and light touch intact proximally x 4. No extinction.  Deep Tendon Reflexes: 2+ and symmetric throughout Plantars: Right: downgoing               Left: downgoing Cerebellar: No ataxia with alternating finger tap of navel and tip of nose bilaterally. Gait: Deferred due to falls risk concerns      Medications:  Current Facility-Administered Medications:  .  acetaminophen (TYLENOL) tablet 1,000 mg, 1,000 mg, Oral, Q6H PRN, Eulah PontBlum, Nina, MD .  Melene Muller[START ON 07/30/2017] amLODipine (NORVASC) tablet 10 mg, 10 mg, Oral, Daily, Santos-Sanchez, Idalys, MD .  hydrALAZINE (APRESOLINE) injection 10 mg, 10 mg, Intravenous, Q4H PRN, Mannam, Praveen, MD, 10 mg at 07/29/17 0928 .  hydrochlorothiazide (HYDRODIURIL) tablet 25 mg, 25 mg, Oral, Daily, Santos-Sanchez, Idalys, MD .  ibuprofen (ADVIL,MOTRIN) tablet 600 mg, 600 mg, Oral, Q6H PRN, Eulah PontBlum, Nina, MD, 600 mg at 07/28/17 2151 .  insulin aspart (novoLOG) injection 0-5 Units, 0-5  Units, Subcutaneous, QHS, Karl Ito, MD, 2 Units at 07/28/17 2200 .  insulin aspart (novoLOG) injection 0-9 Units, 0-9 Units, Subcutaneous, TID WC, Karl Ito, MD, 1 Units at 07/29/17 [phone] .  lisinopril (PRINIVIL,ZESTRIL) tablet 10 mg, 10 mg, Oral, Daily, Karl Ito, MD, 10 mg at 07/29/17 0911  Pertinent Labs/Diagnostics:  MRI HEAD: 07/26/17 IMPRESSION: 1. No acute intracranial process. 2. Moderate white matter changes favoring acute ischemic disease, there could be a component of chronic demyelination. 3. Borderline parenchymal brain volume loss for age.  MRI ORBITS: 07/26/17 IMPRESSION: 1. Mildly patulous optic nerve sheath complexes with flattened edema seen with intracranial hypertension. 2. No MR findings of optic neuritis.  MRI CERVICAL SPINE: 07/26/17 IMPRESSION: 1. No  MR findings of demyelination or acute process in the cervical spine. 2. Degenerative changes cervical spine without canal stenosis. Neural foraminal narrowing C3-4 through C5-6: Moderate on the RIGHT at C3-4 and C4-5 (possibly overestimated on 3 tesla scanner).  Assessment:  William Zuniga is an 37 y.o. male who was sent to the ED from the ophthalmologist's office after exam there revealed severe HTN, disc edema and severe loss of visual acuity OU. His vision first started to become blurry in November. The blurring was bilateral and waxed and waned. He works as a Engineer, production and states today that "I just tried to work through it". Eventually his vision became so impaired that he had to stop working CT head obtained in the ED revealed bilateral subcortical white matter hypoattenuation, which is moderately advanced for age. The pattern is more anterior than posterior, atypical for posterior reversible encephalopathy syndrome. This raises concern for chronic underlying microvascular disease.   1. Papilledema on retinal exam at ophthalmologist's office today on day of admission, also with severe constriction of visual fields and severe impairment of central visual acuity bilaterally. CT head more concerning for hypertensive encephalopathy or age-advanced chronic small vessel ischemic changes, than for PRES. Pseudotumor cerebri is felt to be the most likely component of the DDx,  MRI brain: concerning increased borderline parenchymal brain volume loss for age. MRI orbits - no evident optic neuritis but noted finding concerning for intracranial hypertension.  2. Markedly elevated intracranial pressure-  LP done 07/29/17 - revealed  Markedly elevated opening pressure of 72+ cm water. Closing pressure normalized to 13 after collection of 23 mL clear CSF. 3. MRI Brain review very concerning for demyelination with high suspicion for MS 2. Hypertensive urgency - persistently elevated despite treatment 3. DM 4. Morbid  Obesity   Recommendations: 1. Initiate  Diamox 500 mg by mouth twice a day 2. Obtain neuro surgery ophthalmology consult for assessment of optic nerve sheath fenestration   to salvage vision 3. Patient may require intermittent LP for  Symptomatic relief of intracranial pressure until on therapeutic level of diamox 4. Aggressive BP management 5. MS workup: CSF fluid for IGG index, oligoclonal bands, Serum ACE, Vit B12 and lyme disease  Vivia Ewing DNP Neuro-hospitalist Team [phone] 07/29/2017, 11:47 AM  Attending Neurohospitalist Addendum Patient seen and examined with APP/Resident. Agree with the history and physical as documented above. Agree with the plan as documented, which I helped formulate. I have independently reviewed the chart, obtained history, review of systems and examined the patient.I have personally reviewed pertinent head/neck/spine imaging (CT/MRI). MRI: advanced for age WM lesions. Will discuss with Neurorads further. Please feel free to call with any questions. --- Milon Dikes, MD [facility] Pager: [phone]  If 7pm to 7am, please call on call  as listed on AMION.

## 2017-07-29 NOTE — Progress Notes (Signed)
Report given to [facility] RN on 5[facility]. Pt's VS WNL. Pt to be transferred via wheelchair with nurse.

## 2017-07-29 NOTE — Progress Notes (Addendum)
[place]. Pt arrive to the unit at 1700 via wheel chair.

## 2017-07-30 ENCOUNTER — Inpatient Hospital Stay (HOSPITAL_COMMUNITY): Payer: Medicaid Other

## 2017-07-30 DIAGNOSIS — R509 Fever, unspecified: Secondary | ICD-10-CM

## 2017-07-30 DIAGNOSIS — E118 Type 2 diabetes mellitus with unspecified complications: Secondary | ICD-10-CM

## 2017-07-30 DIAGNOSIS — G932 Benign intracranial hypertension: Principal | ICD-10-CM

## 2017-07-30 LAB — BASIC METABOLIC PANEL
ANION GAP: 13 (ref 5–15)
BUN: 17 mg/dL (ref 6–20)
CHLORIDE: 102 mmol/L (ref 101–111)
CO2: 20 mmol/L — AB (ref 22–32)
Calcium: 9.5 mg/dL (ref 8.9–10.3)
Creatinine, Ser: 1.59 mg/dL — ABNORMAL HIGH (ref 0.61–1.24)
GFR calc Af Amer: >60 mL/min (ref >60)
GFR, EST NON AFRICAN AMERICAN: 54 mL/min — AB (ref >60)
GLUCOSE: 158 mg/dL — AB (ref 65–99)
POTASSIUM: 3.5 mmol/L (ref 3.5–5.1)
Sodium: 135 mmol/L (ref 135–145)

## 2017-07-30 LAB — GLUCOSE, CAPILLARY
GLUCOSE-CAPILLARY: 174 mg/dL — AB (ref 65–99)
GLUCOSE-CAPILLARY: 148 mg/dL — AB (ref 65–99)
Glucose-Capillary: 215 mg/dL — ABNORMAL HIGH (ref 65–99)
Glucose-Capillary: 217 mg/dL — ABNORMAL HIGH (ref 65–99)

## 2017-07-30 LAB — MAGNESIUM: Magnesium: 2.1 mg/dL (ref 1.7–2.4)

## 2017-07-30 LAB — ANGIOTENSIN CONVERTING ENZYME: Angiotensin-Converting Enzyme: <15 U/L (ref 14–82)

## 2017-07-30 MED ORDER — HYDRALAZINE HCL 25 MG PO TABS
25.0000 mg | ORAL_TABLET | Freq: Three times a day (TID) | ORAL | Status: DC
  Administered 2017-07-30: 25 mg via ORAL
  Filled 2017-07-30: qty 1

## 2017-07-30 MED ORDER — ACETAMINOPHEN 500 MG PO TABS: 1000.0000 mg | ORAL_TABLET | Freq: Four times a day (QID) | ORAL | 0 refills | Status: AC | PRN

## 2017-07-30 MED ORDER — ACETAZOLAMIDE 250 MG PO TABS: 500.0000 mg | ORAL_TABLET | Freq: Two times a day (BID) | ORAL | Status: DC

## 2017-07-30 MED ORDER — ONDANSETRON HCL 4 MG PO TABS: 4.0000 mg | ORAL_TABLET | Freq: Three times a day (TID) | ORAL | Status: DC | PRN

## 2017-07-30 MED ORDER — LABETALOL HCL 5 MG/ML IV SOLN
10.0000 mg | Freq: Four times a day (QID) | INTRAVENOUS | Status: DC | PRN
  Administered 2017-07-30: 10 mg via INTRAVENOUS
  Filled 2017-07-30: qty 4

## 2017-07-30 MED ORDER — INSULIN ASPART 100 UNIT/ML ~~LOC~~ SOLN: 0.0000 [IU] | Freq: Three times a day (TID) | SUBCUTANEOUS | Status: DC

## 2017-07-30 MED ORDER — LABETALOL HCL 5 MG/ML IV SOLN: 10.0000 mg | Freq: Four times a day (QID) | INTRAVENOUS | Status: AC | PRN

## 2017-07-30 MED ORDER — INSULIN ASPART 100 UNIT/ML ~~LOC~~ SOLN: 0.0000 [IU] | Freq: Every day | SUBCUTANEOUS | 0 refills | Status: DC

## 2017-07-30 MED ORDER — ONDANSETRON HCL 4 MG PO TABS: 4.0000 mg | ORAL_TABLET | Freq: Three times a day (TID) | ORAL | 0 refills | Status: DC | PRN

## 2017-07-30 MED ORDER — LABETALOL HCL 5 MG/ML IV SOLN: 10.0000 mg | Freq: Four times a day (QID) | INTRAVENOUS | Status: DC | PRN

## 2017-07-30 MED ORDER — AMLODIPINE BESYLATE 10 MG PO TABS: 10.0000 mg | ORAL_TABLET | Freq: Every day | ORAL | Status: DC

## 2017-07-30 MED ORDER — ONDANSETRON HCL 4 MG/2ML IJ SOLN
4.0000 mg | Freq: Three times a day (TID) | INTRAMUSCULAR | Status: DC | PRN
Start: 2017-07-30 — End: 2017-07-31
  Administered 2017-07-30: 4 mg via INTRAVENOUS
  Filled 2017-07-30: qty 2

## 2017-07-30 MED ORDER — HYDRALAZINE HCL 50 MG PO TABS
50.0000 mg | ORAL_TABLET | Freq: Three times a day (TID) | ORAL | Status: DC
  Administered 2017-07-30 – 2017-07-31 (×3): 50 mg via ORAL
  Filled 2017-07-30 (×2): qty 1

## 2017-07-30 MED ORDER — ACETAMINOPHEN 500 MG PO TABS
1000.0000 mg | ORAL_TABLET | Freq: Four times a day (QID) | ORAL | Status: DC | PRN
  Administered 2017-07-30: 1000 mg via ORAL
  Filled 2017-07-30 (×2): qty 2

## 2017-07-30 MED ORDER — HYDRALAZINE HCL 50 MG PO TABS: 50.0000 mg | ORAL_TABLET | Freq: Three times a day (TID) | ORAL | Status: DC

## 2017-07-30 NOTE — Progress Notes (Signed)
MD at the bedside  

## 2017-07-30 NOTE — Progress Notes (Addendum)
Pt has new onset of fever 100.7, pt feels weak and warm,Tylenol 1000mg  for pain only available. MD paged. Per MD Tylenol can be given for fever also, MD will come and evaluate pt.

## 2017-07-30 NOTE — Progress Notes (Addendum)
Neurology Progress Note   S:// Status post spinal tap yesterday. Opening pressure 72. Closing pressure 13 Recommended the primary team contacted ophthalmology for possible optic nerve sheath fenestration.  Next line spoke with ophthalmology at [facility].  They will get back to me or the primary team with the recommendations.   O:// Current vital signs: BP (!) 166/104 (BP Location: Left Arm) Comment: MD aware  Pulse (!) 103   Temp 98.8 F (37.1 C) (Oral)   Resp 16   Ht 5\' 9"  (1.753 m)   Wt 124.9 kg (275 lb 5.7 oz)   SpO2 98%   BMI 40.66 kg/m   Vital signs in last 24 hours: Temp:  [98.1 F (36.7 C)-100.7 F (38.2 C)] 98.8 F (37.1 C) (01/22 0806) Pulse Rate:  [80-107] 103 (01/22 0806) Resp:  [15-24] 16 (01/22 0806) BP: (136-182)/(66-104) 166/104 (01/22 0806) SpO2:  [91 %-99 %] 98 % (01/22 0806) Weight:  [124.9 kg (275 lb 5.7 oz)] 124.9 kg (275 lb 5.7 oz) (01/22 1610) Neuro: Neurological Examination Mental Status: Alert, oriented, thought content appropriate. Speech fluent without evidence of aphasia. Able to follow allcommands without difficulty. Cranial Nerves: RU:EAVW to count fingers in bilateral visual field except for peripheral field, worse on the  left,  Left central and peripheral vision impaired. Pt able to see object, but unable to see the detail. Blunted margins in optic disk. Pupils 5 mm bilaterally and constricting to 3, with  Right afferent defect in dim light.  III,IV, UJ:WJXB without nystagmus. No ptosis. V,VII:No facial droop. Facialtempsensation normal bilaterally VIII:Hearingintact to conversation IX,X:Palaterises symmetrically JY:NWGNFAOZH XII: midline tongue extension Motor: Right :Upper extremity 5/5Left: Upper extremity 5/5 Lower extremity 5/5Lower extremity 5/5 Normal tone throughout; no atrophy noted Sensory:Tempand light  touch intact proximally x 4. No extinction. Deep Tendon Reflexes: 2+ and symmetric throughout Plantars: Right: downgoingLeft: downgoing Cerebellar:No ataxia with alternating finger tap of navel and tip of nose bilaterally. Gait:Deferred due to falls risk concerns  Medications  Current Facility-Administered Medications:  .  acetaminophen (TYLENOL) tablet 1,000 mg, 1,000 mg, Oral, Q6H PRN, Burna Cash, MD, 1,000 mg at 07/30/17 0758 .  acetaZOLAMIDE (DIAMOX) tablet 500 mg, 500 mg, Oral, BID, [place], Pearl, NP, 500 mg at 07/30/17 0929 .  amLODipine (NORVASC) tablet 10 mg, 10 mg, Oral, Daily, Santos-Sanchez, Idalys, MD, 10 mg at 07/30/17 0929 .  hydrALAZINE (APRESOLINE) injection 10 mg, 10 mg, Intravenous, Q4H PRN, Mannam, Praveen, MD, 10 mg at 07/30/17 0229 .  hydrALAZINE (APRESOLINE) tablet 25 mg, 25 mg, Oral, Q8H, Nedrud, Marybeth, MD .  ibuprofen (ADVIL,MOTRIN) tablet 600 mg, 600 mg, Oral, Q6H PRN, Eulah Pont, MD, 600 mg at 07/29/17 1630 .  insulin aspart (novoLOG) injection 0-5 Units, 0-5 Units, Subcutaneous, QHS, Karl Ito, MD, 2 Units at 07/28/17 2200 .  insulin aspart (novoLOG) injection 0-9 Units, 0-9 Units, Subcutaneous, TID WC, Karl Ito, MD, 3 Units at 07/30/17 1249 .  ondansetron (ZOFRAN) tablet 4 mg, 4 mg, Oral, Q8H PRN **OR** ondansetron (ZOFRAN) injection 4 mg, 4 mg, Intravenous, Q8H PRN, Toney Rakes, MD, 4 mg at 07/30/17 0552   Labs CBC    Component Value Date/Time   WBC 9.0 07/28/2017 0307   RBC 4.61 07/28/2017 0307   HGB 14.2 07/28/2017 0307   HCT 41.0 07/28/2017 0307   PLT 305 07/28/2017 0307   MCV 88.9 07/28/2017 0307   MCH 30.8 07/28/2017 0307   MCHC 34.6 07/28/2017 0307   RDW 13.6 07/28/2017 0307   CMP  Component Value Date/Time   NA 135 07/30/2017 0438   K 3.5 07/30/2017 0438   CL 102 07/30/2017 0438   CO2 20 (L) 07/30/2017 0438   GLUCOSE 158 (H) 07/30/2017 0438   BUN 17 07/30/2017 0438   CREATININE  1.59 (H) 07/30/2017 0438   CALCIUM 9.5 07/30/2017 0438   PROT 7.5 07/27/2017 0626   ALBUMIN 3.4 (L) 07/27/2017 0626   AST 15 07/27/2017 0626   ALT 15 (L) 07/27/2017 0626   ALKPHOS 48 07/27/2017 0626   BILITOT 0.9 07/27/2017 0626   GFRNONAA 54 (L) 07/30/2017 0438   GFRAA >60 07/30/2017 0438  CSF labs unremarkable thus far.  Imaging I have reviewed images in epic and the results pertinent to this consultation are: MRI examination of the brain shows multiple T2 hyperintensities, with couple of lesions which are very periventricular and could be concerning for demyelination but rest of them look like accelerated white matter disease as seen in setting of retention. MRI of the orbits shows patulous optic nerve sheath with flattened base as seen with intracranial hypertension.    Assessment:  37 year old man sent in for evaluation from an ophthalmologist office for bilateral papilledema. He has progressively worsening vision to the point of near vision loss since the past 3-4 months. MRI brain and orbits does not show any optic nerve lesions or enhancement. Initial presentation was with very high blood pressure and gradual vision loss-concerning for PRES but MRI is not suggestive of PRES. Spinal tap was done-opening pressure 72 cm water.  Closing pressure after 23 cc was drained-13 said to be to water. 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  Next line case discussed with [facility] ophthalmology.  Recommend obtaining an MRV to complete the workup prior to making the diagnosis of idiopathic intracranial hypertension.  Impression: Idiopathic intracranial hypertension  Recommendations: -Continue with Diamox -Ophthalmology consultation-spoke with [facility] regarding recommendations on optic nerve fenestration.  If patient is unable to make an appointment there, would recommend an inpatient ophthalmology consult and would appreciate ophthalmology help with recommendations or opinion on optic nerve  fenestration to prevent vision loss and or reverse it. -Ophthalmology consultation might be helpful also to see if optic nerve fenestration might not be helpful and he might require a VP shunt. -His headaches are much better today.  I do not think he needs a spinal tap today. -Consider frequent spinal taps if symptoms worsen. -CSF labs for MS pending at this time, suspicion is low. -Obtain MRV of the head for [facility]. -We will follow.  -- Milon DikesAshish Waleed Dettman, MD [facility] Pager: [phone] If 7pm to 7am, please call on call as listed on AMION.

## 2017-07-30 NOTE — Progress Notes (Signed)
Pt c/o nausea, no PRN meds ordered. MD paged.

## 2017-07-30 NOTE — Progress Notes (Signed)
Subjective:  Patient required 2 doses of IV hydralazine overnight. BP 140-170s. He also reports feeling hot and nauseous overnight. This morning T 100.7. He reported a mild headache that is improved since admission, but he os otherwise feeling well. Denies chest pain, shortness of breath, cough, abdominal pain, N/V, urinary symptoms, and changes in bowel movements. Discussed with patient we are making changes in antihypertensive medications due to decrease in renal function. Also [facility]. Wife present at bedside. They voiced understanding and are in agreement with plan.   Objective:  Vital signs in last 24 hours: Vitals:   07/30/17 0346 07/30/17 0548 07/30/17 0552 07/30/17 0737  BP: 136/66 (!) 156/92    Pulse: 98 (!) 107    Resp:  20    Temp:  99.7 F (37.6 C)  (!) 100.7 F (38.2 C)  TempSrc:  Oral  Oral  SpO2:  94%    Weight:   275 lb 5.7 oz (124.9 kg)   Height:       Physical Exam  Constitutional: He is oriented to person, place, and time. He appears well-developed and well-nourished. No distress.  Cardiovascular: Normal rate, regular rhythm and normal heart sounds. Exam reveals no gallop and no friction rub.  No murmur heard. Pulmonary/Chest: Effort normal and breath sounds normal. No respiratory distress. He has no wheezes. He has no rales.  Abdominal: Soft. Bowel sounds are normal. He exhibits no distension. There is no tenderness.  Musculoskeletal: Normal range of motion. He exhibits no edema.  Neurological: He is alert and oriented to person, place, and time.  Vision mildly improved   Skin: He is not diaphoretic.    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 He was admitted to the ICU and started on nicardipine gtt. Neurology consulted and following. Fluoro-guided LP with OP 72 and patient started on Diamox 500 BID. CSF cryptococcal antigen negative. Neurology recommending transfer to tertiary care center for optic nerve sheath fenestration. Called [facility] and neurosurgeon out of town until next week. [facility]  - Neurology following, appreciate recommendations  - CSF: culture negative, crypto antigen negative, protein 29, and glucose 92 - Follow up CSF fungal stain and culture, IgG CSF index, Lyme, and oligloclonal bands  - Hold HCTZ 25 mg QD and lisinopril in the setting of worsening renal function  - Start hydralazine 25 mg TID - Continue amlodipine 10 mg QD  - Continue lisinopril 10 mg QD. Will not increase at this time given mild AKI  - Continue hydralazine PRN - Will need to establish with PCP prior to discharge preferably   # Fever: Patient developed T 100.7 this morning. Denied any associated symptoms other than HA that is improved after LP yesterday. Low suspicion for an infectious etiology at this time given no associated symptoms. Repeat T 98.8.  - Will continue to monitor   # T2DM: uncontrolled, A1c 8.4 1/19/019. Reports he was previously on insulin but stopped. CBGs 150-172 in the  past 24h.  - Continue SSI-S + HS coverage  - Continue CBG monitoring  - CM diet  - Will need PO hypoglycemic agents on discharge.Woudl recommend starting metformin 500 mg BID and uptitrate to 1000 mg BID. Might need a second agent, maybe Jardiance to help with weight loss which could also benefit him with his HTN    # Hypokalemia, Hypomagnesemia: Resolved    Dispo: Anticipated discharge in approximately 1-3 day(s) pending BP optimization and management of pseudotumor cerebri (may be transferred today).   Burna Cash, MD 07/30/2017, 7:42 AM Pager: [phone]

## 2017-07-30 NOTE — Discharge Summary (Signed)
Name: William Zuniga MRN: [id number] DOB: 1980/10/21 37 y.o. PCP: Patient, No Pcp Per  Date of Admission: 07/26/2017  1:56 PM Date of Discharge:  Attending Physician: Earl Lagos, MD  Discharge Diagnosis: 1. Idiopathic Intracranial Hypertension 2. 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   acetaZOLAMIDE 250 MG tablet Commonly known as:  DIAMOX Take 2 tablets (500 mg total) by mouth 2 (two) times daily. Start taking on:  07/31/2017   amLODipine 10 MG tablet Commonly known as:  NORVASC Take 1 tablet (10 mg total) by mouth daily. Start taking on:  07/31/2017 What changed:    medication strength  how much to take   hydrALAZINE 50 MG tablet Commonly known as:  APRESOLINE Take 1 tablet (50 mg total) by mouth every 8 (eight) hours. Start taking on:  07/31/2017   insulin aspart 100 UNIT/ML injection Commonly known as:  novoLOG Inject 0-9 Units into the skin 3 (three) times daily with meals. Start taking on:  07/31/2017   insulin aspart 100 UNIT/ML injection Commonly known as:  novoLOG Inject 0-5 Units into the skin at bedtime. Start taking on:  07/31/2017   labetalol 5 MG/ML injection Commonly known as:  NORMODYNE,TRANDATE Inject 2 mLs (10 mg total) into the vein every 6 (six) hours as needed (SBP > 160).   ondansetron 4 MG tablet Commonly known as:  ZOFRAN Take 1 tablet (4 mg total) by mouth every 8  (eight) hours as needed for nausea or vomiting.       Disposition and follow-up:   Mr.William Zuniga was discharged from [facility] in Fair condition.  At the hospital follow up visit please address:  1.   Idiopathic Intracranial Hypertension -Ophthalmology follow up/establishment   Hypertension, uncontrolled -Started on Hydralazine 50 mg TID and Amlodipine 10 mg daily -Patient developed AKI after initiating lisinopril and HCTZ - these medications were discontinued and the  above regimen was started -BP control, medication compliance   Type 2 Diabetes -Uncontrolled, A1C 8.9 -Not on any medication prior to hospitalization  -Managed with sliding scale insulin while hospitalized  -Will need outpatient management and follow up   2.  Labs / imaging needed at time of follow-up: none  3.  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 Idiopathic Intracranial Hypertension  [name] was admitted to [facility] and the Internal Medicine Teaching Service for evaluation of gradual vision loss (3-4 months) and bilateral papilledema noted by an Ophthalmologist. CT scan showed white matter changes throughout bilateral anterior cerebral cortex. She was started on nicardipine drip and admitted to the ICU for strict BP monitoring and management for initial concern of PRES. Neurology was consulted  and had concern for Pseudotumor cerebri and cryptococcal meningitis. MRI brain and orbits did not show any optic nerve lesions or enhancement, MRI is not suggestive of PRES. IR guided lumbar puncture was completed and opening pressure was recorded at 72+ cm water. 23 ml of CSF were obtained for laboratory studies. Closing  pressure 13 cm of water. The patient tolerated the procedure well and there were no apparent complications. Diamox 500 mg BID was started. CSF culture remains negative to date. Cryptococcal antigen was negative.   The patient had minimal improvement in vision during hospitalization and Neurology recommended transfer to tertiary care center for evaluation of Optic Nerve Sheath Fenestration or VP shunt placement.   2. Uncontrolled Hypertension  Mr. William PullingHammonds blood pressure was consistently elevated during hospitalization and difficult to control. On admission, he was transferred to the ICU and placed on a nicardipine drip for strict blood pressure management and monitoring. He was weaned off cardene and managed with IV hydralazine PRN, and started on amlodipine, lisinopril, and clonidine. His blood pressure regimen was changed due to development of AKI. Regimen at discharge/transfer: hydralazine 50 mg TID and Amlodipine 10 mg.   Discharge Vitals:   BP (!) 143/88 (BP Location: Right Arm)   Pulse 92   Temp 98.2 F (36.8 C) (Oral)   Resp 18   Ht 5\' 9"  (1.753 m)   Wt 275 lb 5.7 oz (124.9 kg)   SpO2 97%   BMI 40.66 kg/m   Pertinent Labs, Studies, and Procedures:  BMP Latest Ref Rng & Units 07/30/2017 07/29/2017 07/28/2017  Glucose 65 - 99 mg/dL 130(Q158(H) 657(Q151(H) 469(G162(H)  BUN 6 - 20 mg/dL 17 14 11   Creatinine 0.61 - 1.24 mg/dL 2.95(M1.59(H) 8.41(L1.27(H) 2.441.13  Sodium 135 - 145 mmol/L 135 138 138  Potassium 3.5 - 5.1 mmol/L 3.5 3.5 3.4(L)  Chloride 101 - 111 mmol/L 102 105 104  CO2 22 - 32 mmol/L 20(L) 22 23  Calcium 8.9 - 10.3 mg/dL 9.5 9.4 9.1   CBC Latest Ref Rng & Units 07/28/2017 07/26/2017  WBC 4.0 - 10.5 K/uL 9.0 7.8  Hemoglobin 13.0 - 17.0 g/dL 01.014.2 27.214.9  Hematocrit 53.639.0 - 52.0 % 41.0 43.5  Platelets 150 - 400 K/uL 305 317   MRI Brain, Orbits, and Cervical Spine IMPRESSION: MRI HEAD: 1. No acute intracranial process. 2. Moderate white matter changes favoring acute ischemic disease, there could be a  component of chronic demyelination. 3. Borderline parenchymal brain volume loss for age.  MRI ORBITS: 1. Mildly patulous optic nerve sheath complexes with flattened edema seen with intracranial hypertension. 2. No MR findings of optic neuritis.  MRI CERVICAL SPINE: 1. No MR findings of demyelination or acute process in the cervical spine. 2. Degenerative changes cervical spine without canal stenosis. Neural foraminal narrowing C3-4 through C5-6: Moderate on the RIGHT at C3-4 and C4-5 (possibly overestimated on 3 tesla scanner).  MR Venogram FINDINGS: Normal flow related enhancement within the superior sagittal sinus, torcula of the Herophili and included internal jugular veins. There is attenuated flow related enhancement bilateral transverse and proximal sigmoid sinuses. Normal flow related enhancement of the internal cerebral veins. IMPRESSION: 1. Attenuated bilateral transverse to sigmoid dural venous sinuses favoring developmental variant and/or artifact, less likely thrombosis. Patent superior sagittal sinus.  Transthoracic Echocardiogram ------------------------------------------------------------------- Study Conclusions  - Left ventricle: Inferobasal hypokinesis. The cavity size was   mildly dilated. Wall thickness was increased in a pattern of   moderate LVH. Systolic function was normal. The estimated   ejection fraction  was 55%. Wall motion was normal; there were no   regional wall motion abnormalities. Left ventricular diastolic   function parameters were normal. - Mitral valve: There was mild regurgitation. - Left atrium: The atrium was moderately dilated. - Pericardium, extracardiac: A trivial pericardial effusion was   identified posterior to the heart.  Discharge Instructions:   Signed: Toney Rakes, MD 07/30/2017, 10:58 PM   Pager: [phone]

## 2017-07-30 NOTE — Progress Notes (Addendum)
Pt BP 172/92, Hydralazine 10 mg IV PRN given at 2229 for BP 162/90. Pt asymptomatic. MD on call notified. Instructions given to monitor pt at this time and recheck BP around 2 am.

## 2017-07-31 DIAGNOSIS — Z79899 Other long term (current) drug therapy: Secondary | ICD-10-CM

## 2017-07-31 LAB — BASIC METABOLIC PANEL
Anion gap: 11 (ref 5–15)
BUN: 20 mg/dL (ref 6–20)
CHLORIDE: 103 mmol/L (ref 101–111)
CO2: 20 mmol/L — ABNORMAL LOW (ref 22–32)
CREATININE: 1.8 mg/dL — AB (ref 0.61–1.24)
Calcium: 9.2 mg/dL (ref 8.9–10.3)
GFR, EST AFRICAN AMERICAN: 53 mL/min — AB (ref >60)
GFR, EST NON AFRICAN AMERICAN: 46 mL/min — AB (ref >60)
Glucose, Bld: 177 mg/dL — ABNORMAL HIGH (ref 65–99)
Potassium: 3.4 mmol/L — ABNORMAL LOW (ref 3.5–5.1)
SODIUM: 134 mmol/L — AB (ref 135–145)

## 2017-07-31 LAB — GLUCOSE, CAPILLARY
GLUCOSE-CAPILLARY: 170 mg/dL — AB (ref 65–99)
Glucose-Capillary: 222 mg/dL — ABNORMAL HIGH (ref 65–99)
Glucose-Capillary: 202 mg/dL — ABNORMAL HIGH (ref 65–99)

## 2017-07-31 LAB — LYME DISEASE DNA BY PCR(BORRELIA BURG): Lyme Disease(B.burgdorferi)PCR: NEGATIVE

## 2017-07-31 LAB — OLIGOCLONAL BANDS, CSF + SERM

## 2017-07-31 MED ORDER — SODIUM CHLORIDE 0.9 % IV SOLN
INTRAVENOUS | Status: DC
  Administered 2017-07-31: 07:00:00 via INTRAVENOUS

## 2017-07-31 NOTE — Progress Notes (Signed)
[facility]. Pt alert and oriented x4. VSS. Pt c/o no pain at this time. No signs of respiratory distress. Education complete and care plans resolved. No further issues at this time. Pt to follow up with PCP. Jillyn HiddenStone,Sandor Arboleda R, RN

## 2017-07-31 NOTE — Progress Notes (Signed)
[facility] bed placement called for update. Information given: Still waiting on bed availability.

## 2017-07-31 NOTE — Progress Notes (Signed)
Subjective:  No acute events overnight blood pressure remains elevated in the 140s-180s.  He continues to endorse a headache that is stable from yesterday.  No other complaints this morning.  Explained to patient he will be transferred to [facility] for further evaluation and management of IIH.  Patient voiced understanding and is in agreement with plan.  All questions answered.  Objective:  Vital signs in last 24 hours: Vitals:   07/30/17 1855 07/30/17 1951 07/30/17 2133 07/31/17 0501  BP: (!) 180/105 (!) 168/94 (!) 143/88 135/74  Pulse:   92 88  Resp:   18 (!) 22  Temp:   98.2 F (36.8 C) 99.7 F (37.6 C)  TempSrc:   Oral Oral  SpO2:   97% 96%  Weight:      Height:       General: pleasant male, obese, well-developed, in no acute distress  Cardiac: regular rate and rhythm, nl S1/S2, no murmurs, rubs or gallops  Pulm: CTAB, no wheezes or crackles, no increased work of breathing  Abd: soft, NTND, bowel sounds present  Neuro: A&Ox3, bilateral  Blindness, otherwise no focal deficits  Ext: warm and well perfused, no peripheral edema  Assessment/Plan:  Principal Problem:   Hypertensive crisis Active Problems:   Papilledema   PRES (posterior reversible encephalopathy syndrome)   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 He was admitted to the ICU and started on nicardipine gtt. Neurology consulted and following. Fluoro-guided LP 1/21 with OP 72 and patient started on Diamox 500 BID. CSF cryptococcal antigen negative. MRV negative for venous thrombosis. Neurology recommending transfer to  tertiary care center for optic nerve sheath fenestration.  Patient will be transferred to [facility] neurology. - Neurology following, appreciate recommendations  - CSF: culture negative, crypto antigen negative, protein 29, and glucose 92, oligoclonal bands , ACE < 15  -Follow up CSF fungal stain and culture, IgG CSF index, Lyme  - Hold HCTZ 25 mg QDand lisinopril in the setting of worsening renal function  - Continue hydralazine 50 mg TID -Continue amlodipine10 mg QD  - Discontinue lisinopril and HCTZ in the setting of worsening renal function   - IV labetalol PRN for sBP > 160 - Will need to establish with PCPprior to discharge preferably   # AKI: Patient developed acute kidney injury after starting lisinopril and HCTZ for BP control.  His creatinine has been downtrending for the past 3 days despite discontinuing lisinopril and HCTZ 1 day ago.  IV hydration started today but low suspicion that this could be prerenal in etiology. - Continue to monitor renal function  - Will need further evaluation of this if renal function continues to decline (urine studies, renal ultrasound, etc.)  # Fever: Resolved after Tylenol x1. Has remained afebrile. No signs/symptoms concerning for active infection and therefore will not order infectious work-up.   # T2DM: uncontrolled, A1c 8.4 1/19/019. Reports he was previously on insulin but stopped. CBGs 170-220 in the past 24h. - Continue SSI-S + HS coverage - Continue CBG monitoring  -CMdiet  - Will need PO hypoglycemic agents on discharge.Would recommend starting metformin 500 mg BID and uptitrate to 1000 mg BID. Might need a second agent, maybe Jardiance  to help with weight loss which could also benefit him with his HTN    Dispo: Anticipated discharge today as [facility].   William CashSantos-Sanchez, William Biby, MD 07/31/2017, 6:44 AM Pager: [phone]

## 2017-07-31 NOTE — Progress Notes (Signed)
Call [facility]. MD consulted, and per MD pt needs to be transferred if possible during the night. [facility], spoken with Cathie HoopsWanda Rainey. She will call back when bed is available, and we will arrange transportation. MD aware.

## 2017-08-01 LAB — CSF CULTURE W GRAM STAIN: Culture: NO GROWTH

## 2017-08-01 LAB — CSF CULTURE

## 2017-08-01 MED ORDER — ENOXAPARIN SODIUM 40 MG/0.4ML ~~LOC~~ SOLN
40.00 | SUBCUTANEOUS | Status: DC
Start: 2017-08-02 — End: 2017-08-01

## 2017-08-01 MED ORDER — DEXTROSE 50 % IV SOLN
12.50 | INTRAVENOUS | Status: DC
Start: ? — End: 2017-08-01

## 2017-08-01 MED ORDER — INSULIN REGULAR HUMAN 100 UNIT/ML IJ SOLN
0.00 | INTRAMUSCULAR | Status: DC
Start: 2017-08-03 — End: 2017-08-01

## 2017-08-01 MED ORDER — HYDRALAZINE HCL 20 MG/ML IJ SOLN
5.00 | INTRAMUSCULAR | Status: DC
Start: ? — End: 2017-08-01

## 2017-08-01 MED ORDER — ACETAZOLAMIDE 250 MG PO TABS
250.00 | ORAL_TABLET | ORAL | Status: DC
Start: 2017-08-01 — End: 2017-08-01

## 2017-08-01 MED ORDER — LIDOCAINE HCL 1 % IJ SOLN
.50 | INTRAMUSCULAR | Status: DC
Start: ? — End: 2017-08-01

## 2017-08-01 MED ORDER — ACETAZOLAMIDE 250 MG PO TABS
500.00 | ORAL_TABLET | ORAL | Status: DC
Start: 2017-08-06 — End: 2017-08-01

## 2017-08-01 MED ORDER — SENNOSIDES-DOCUSATE SODIUM 8.6-50 MG PO TABS
1.00 | ORAL_TABLET | ORAL | Status: DC
Start: ? — End: 2017-08-01

## 2017-08-01 MED ORDER — GLUCAGON HCL RDNA (DIAGNOSTIC) 1 MG IJ SOLR
1.00 | INTRAMUSCULAR | Status: DC
Start: ? — End: 2017-08-01

## 2017-08-01 MED ORDER — HYDRALAZINE HCL 50 MG PO TABS
50.00 | ORAL_TABLET | ORAL | Status: DC
Start: 2017-08-05 — End: 2017-08-01

## 2017-08-02 LAB — IGG CSF INDEX
Albumin CSF-mCnc: 19 mg/dL (ref 11–48)
Albumin: 4.1 g/dL (ref 3.5–5.5)
IGG INDEX CSF: 0.6 (ref 0.0–0.7)
IGG/ALB RATIO, CSF: 0.23 (ref 0.00–0.25)
IgG (Immunoglobin G), Serum: 1546 mg/dL (ref 700–1600)
IgG, CSF: 4.4 mg/dL (ref 0.0–8.6)

## 2017-08-02 LAB — ALDOSTERONE + RENIN ACTIVITY W/ RATIO
ALDO / PRA Ratio: 1 (ref 0.0–30.0)
Aldosterone: 2.1 ng/dL (ref 0.0–30.0)
PRA LC/MS/MS: 2.05 ng/mL/hr (ref 0.167–5.380)

## 2017-08-02 MED ORDER — ONDANSETRON HCL 4 MG/2ML IJ SOLN
4.00 | INTRAMUSCULAR | Status: DC
Start: ? — End: 2017-08-02

## 2017-08-02 MED ORDER — LABETALOL HCL 5 MG/ML IV SOLN
10.00 | INTRAVENOUS | Status: DC
Start: ? — End: 2017-08-02

## 2017-08-02 MED ORDER — LACTATED RINGERS IV SOLN
INTRAVENOUS | Status: DC
Start: ? — End: 2017-08-02

## 2017-08-02 MED ORDER — SODIUM CHLORIDE 0.9 % IV SOLN
INTRAVENOUS | Status: DC
Start: ? — End: 2017-08-02

## 2017-08-02 MED ORDER — FENTANYL CITRATE (PF) 2500 MCG/50ML IJ SOLN
25.00 | INTRAMUSCULAR | Status: DC
Start: ? — End: 2017-08-02

## 2017-08-02 MED ORDER — LIDOCAINE HCL 1 % IJ SOLN
0.50 | INTRAMUSCULAR | Status: DC
Start: ? — End: 2017-08-02

## 2017-08-02 MED ORDER — GENERIC EXTERNAL MEDICATION
6.25 | Status: DC
Start: ? — End: 2017-08-02

## 2017-08-02 MED ORDER — ACETAMINOPHEN 325 MG PO TABS
650.00 | ORAL_TABLET | ORAL | Status: DC
Start: ? — End: 2017-08-02

## 2017-08-05 MED ORDER — CARVEDILOL 3.125 MG PO TABS
3.13 | ORAL_TABLET | ORAL | Status: DC
Start: 2017-08-05 — End: 2017-08-05

## 2017-08-05 MED ORDER — AMLODIPINE BESYLATE 10 MG PO TABS
10.00 | ORAL_TABLET | ORAL | Status: DC
Start: 2017-08-07 — End: 2017-08-05

## 2017-08-05 MED ORDER — INSULIN REGULAR HUMAN 100 UNIT/ML IJ SOLN
0.00 | INTRAMUSCULAR | Status: DC
Start: 2017-08-06 — End: 2017-08-05

## 2017-08-05 MED ORDER — NEOMYCIN-POLYMYXIN-DEXAMETH 3.5-10000-0.1 OP OINT
1.00 | TOPICAL_OINTMENT | OPHTHALMIC | Status: DC
Start: 2017-08-06 — End: 2017-08-05

## 2017-08-05 MED ORDER — NEOMYCIN-POLYMYXIN-DEXAMETH 3.5-10000-0.1 OP SUSP
1.00 | OPHTHALMIC | Status: DC
Start: 2017-08-06 — End: 2017-08-05

## 2017-08-05 MED ORDER — BENGAY GREASELESS 10-15 % EX CREA
TOPICAL_CREAM | CUTANEOUS | Status: DC
Start: ? — End: 2017-08-05

## 2017-08-06 MED ORDER — LISINOPRIL 5 MG PO TABS
5.00 | ORAL_TABLET | ORAL | Status: DC
Start: 2017-08-06 — End: 2017-08-06

## 2017-08-06 MED ORDER — HYDRALAZINE HCL 25 MG PO TABS
25.00 | ORAL_TABLET | ORAL | Status: DC
Start: 2017-08-06 — End: 2017-08-06

## 2017-08-06 MED ORDER — CARVEDILOL 6.25 MG PO TABS
6.25 | ORAL_TABLET | ORAL | Status: DC
Start: 2017-08-06 — End: 2017-08-06

## 2017-08-08 MED ORDER — LISINOPRIL 5 MG PO TABS
5.00 | ORAL_TABLET | ORAL | Status: DC
Start: 2017-08-07 — End: 2017-08-08

## 2017-08-09 LAB — B. BURGDORFI ANTIBODIES, CSF

## 2017-08-28 LAB — FUNGUS CULTURE WITH STAIN

## 2017-08-28 LAB — FUNGUS CULTURE RESULT

## 2017-08-28 LAB — FUNGAL ORGANISM REFLEX

## 2017-08-30 ENCOUNTER — Other Ambulatory Visit: Payer: Self-pay

## 2017-08-30 ENCOUNTER — Encounter: Payer: Self-pay | Admitting: Emergency Medicine

## 2017-08-30 ENCOUNTER — Ambulatory Visit (INDEPENDENT_AMBULATORY_CARE_PROVIDER_SITE_OTHER): Payer: Medicaid Other | Admitting: Emergency Medicine

## 2017-08-30 VITALS — BP 130/94 | HR 73 | Temp 97.9°F | Resp 16 | Ht 70.0 in | Wt 277.0 lb

## 2017-08-30 DIAGNOSIS — G932 Benign intracranial hypertension: Secondary | ICD-10-CM | POA: Insufficient documentation

## 2017-08-30 DIAGNOSIS — I10 Essential (primary) hypertension: Secondary | ICD-10-CM | POA: Insufficient documentation

## 2017-08-30 DIAGNOSIS — Z8639 Personal history of other endocrine, nutritional and metabolic disease: Secondary | ICD-10-CM | POA: Diagnosis not present

## 2017-08-30 MED ORDER — LISINOPRIL 5 MG PO TABS: 5.0000 mg | ORAL_TABLET | Freq: Every day | ORAL | 3 refills | Status: DC

## 2017-08-30 MED ORDER — POTASSIUM CHLORIDE CRYS ER 10 MEQ PO TBCR: 10.0000 meq | EXTENDED_RELEASE_TABLET | Freq: Two times a day (BID) | ORAL | 3 refills | Status: DC

## 2017-08-30 MED ORDER — AMLODIPINE BESYLATE 10 MG PO TABS: 10.0000 mg | ORAL_TABLET | Freq: Every day | ORAL | 3 refills | Status: DC

## 2017-08-30 MED ORDER — ACETAZOLAMIDE 250 MG PO TABS: 500.0000 mg | ORAL_TABLET | Freq: Two times a day (BID) | ORAL | 3 refills | Status: DC

## 2017-08-30 MED ORDER — CARVEDILOL 6.25 MG PO TABS: 6.2500 mg | ORAL_TABLET | Freq: Two times a day (BID) | ORAL | 3 refills | Status: DC

## 2017-08-30 NOTE — Progress Notes (Signed)
William Zuniga 37 y.o.   Chief Complaint  Patient presents with  . Establish Care    discuss HTN problems and diabets    HISTORY OF PRESENT ILLNESS: This is a 37 y.o. male here to establish care.  Has a history of hypertension on multiple medications.  Also has a history of diabetes but presently on no medications.  Has a history of idiopathic intracranial hypertension.  Has no complaints at the present time.  No medical concerns.  Legally blind. Pertinent labs & imaging results that were available during my care of the patient were reviewed by me and considered in my medical decision making (see chart for details).   HPI   Prior to Admission medications   Medication Sig Start Date End Date Taking? Authorizing Provider  acetaminophen (TYLENOL) 500 MG tablet Take 2 tablets (1,000 mg total) by mouth every 6 (six) hours as needed for mild pain, moderate pain or fever. 07/30/17  Yes Rice, Jamesetta Orleans, MD  acetaZOLAMIDE (DIAMOX) 250 MG tablet Take 2 tablets (500 mg total) by mouth 2 (two) times daily. 07/31/17  Yes Rice, Jamesetta Orleans, MD  amLODipine (NORVASC) 10 MG tablet Take 1 tablet (10 mg total) by mouth daily. 07/31/17  Yes Rice, Jamesetta Orleans, MD  carvedilol (COREG) 6.25 MG tablet Take 6.25 mg by mouth every 12 (twelve) hours.   Yes [provider]  lisinopril (PRINIVIL,ZESTRIL) 5 MG tablet Take 5 mg by mouth daily.   Yes [provider]  potassium chloride (K-DUR,KLOR-CON) 10 MEQ tablet Take 10 mEq by mouth 2 (two) times daily.   Yes [provider]  hydrALAZINE (APRESOLINE) 50 MG tablet Take 1 tablet (50 mg total) by mouth every 8 (eight) hours. 07/31/17   Rice, Jamesetta Orleans, MD  insulin aspart (NOVOLOG) 100 UNIT/ML injection Inject 0-9 Units into the skin 3 (three) times daily with meals. Patient not taking: Reported on 08/30/2017 07/31/17   Fuller Plan, MD  insulin aspart (NOVOLOG) 100 UNIT/ML injection Inject 0-5 Units into the skin at  bedtime. Patient not taking: Reported on 08/30/2017 07/31/17   Fuller Plan, MD  labetalol (NORMODYNE,TRANDATE) 5 MG/ML injection Inject 2 mLs (10 mg total) into the vein every 6 (six) hours as needed (SBP > 160). Patient not taking: Reported on 08/30/2017 07/30/17   Fuller Plan, MD  ondansetron (ZOFRAN) 4 MG tablet Take 1 tablet (4 mg total) by mouth every 8 (eight) hours as needed for nausea or vomiting. Patient not taking: Reported on 08/30/2017 07/30/17   Fuller Plan, MD    No Known Allergies  Patient Active Problem List   Diagnosis Date Noted  . Hypertensive crisis 07/26/2017  . 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 PRES (posterior reversible encephalopathy syndrome) 07/26/2017  . 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 Diabetes mellitus without complication (HCC)   . Hypertension     No past surgical history on file.  Social History   Socioeconomic History  . Marital status: Married    Spouse name: Not on file  . Number of children: Not on file  . Years of education: Not on file  . Highest education level: Not on file  Social Needs  . Financial resource strain: Not on file  . Food insecurity - worry: Not on file  . Food insecurity - inability: Not on file  . Transportation needs - medical: Not on file  . Transportation needs - non-medical: Not on  file  Occupational History  . Not on file  Tobacco Use  . Smoking status: Never Smoker  . Smokeless tobacco: Never Used  Substance and Sexual Activity  . Alcohol use: No    Frequency: Never  . Drug use: No  . Sexual activity: Not on file  Other Topics Concern  . Not on file  Social History Narrative  . Not on file    No family history on file.   Review of Systems  Constitutional: Negative.  Negative for chills, diaphoresis, fever and malaise/fatigue.  HENT: Negative.  Negative for congestion, hearing loss, nosebleeds and sore throat.    Eyes: Negative.  Negative for blurred vision and double vision.       Legally blind  Respiratory: Negative.  Negative for cough and shortness of breath.   Cardiovascular: Negative.  Negative for chest pain and palpitations.  Gastrointestinal: Negative.  Negative for abdominal pain, nausea and vomiting.  Genitourinary: Negative.   Musculoskeletal: Negative.  Negative for myalgias and neck pain.  Skin: Negative.  Negative for rash.  Neurological: Negative.  Negative for dizziness and headaches.  Endo/Heme/Allergies: Negative.   All other systems reviewed and are negative.   Vitals:   08/30/17 1459 08/30/17 1542  BP: (!) 138/100 (!) 130/94  Pulse: 73   Resp: 16   Temp: 97.9 F (36.6 C)   SpO2: 99%     Physical Exam  Constitutional: He is oriented to person, place, and time. He appears well-developed and well-nourished.  HENT:  Head: Normocephalic and atraumatic.  Nose: Nose normal.  Mouth/Throat: Oropharynx is clear and moist.  Eyes: Conjunctivae and EOM are normal. Pupils are equal, round, and reactive to light.  Neck: Normal range of motion. Neck supple. No JVD present.  Cardiovascular: Normal rate, regular rhythm and normal heart sounds.  Pulmonary/Chest: Effort normal and breath sounds normal.  Abdominal: Soft. Bowel sounds are normal. He exhibits no distension. There is no tenderness.  Musculoskeletal: Normal range of motion.  Lymphadenopathy:    He has no cervical adenopathy.  Neurological: He is alert and oriented to person, place, and time. No sensory deficit. He exhibits normal muscle tone.  Skin: Skin is warm and dry. Capillary refill takes less than 2 seconds. No rash noted.  Psychiatric: He has a normal mood and affect. His behavior is normal.  Vitals reviewed.    ASSESSMENT & PLAN: William Zuniga was seen today for establish care.  Diagnoses and all orders for this visit:  Essential hypertension, malignant -     amLODipine (NORVASC) 10 MG tablet; Take 1 tablet  (10 mg total) by mouth daily. -     lisinopril (PRINIVIL,ZESTRIL) 5 MG tablet; Take 1 tablet (5 mg total) by mouth daily. -     carvedilol (COREG) 6.25 MG tablet; Take 1 tablet (6.25 mg total) by mouth every 12 (twelve) hours. -     CBC with Differential/Platelet -     Comprehensive metabolic panel  Idiopathic intracranial hypertension -     acetaZOLAMIDE (DIAMOX) 250 MG tablet; Take 2 tablets (500 mg total) by mouth 2 (two) times daily.  History of diabetes mellitus -     Hemoglobin A1c  History of hypokalemia -     potassium chloride (K-DUR,KLOR-CON) 10 MEQ tablet; Take 1 tablet (10 mEq total) by mouth 2 (two) times daily.    Patient Instructions       IF you received an x-ray today, you will receive an invoice from [facility]. Please contact [facility] at [phone]  with questions or concerns regarding your invoice.   IF you received labwork today, you will receive an invoice from [facility]. Please contact [facility] at [phone] with questions or concerns regarding your invoice.   Our billing staff will not be able to assist you with questions regarding bills from these companies.  You will be contacted with the lab results as soon as they are available. The fastest way to get your results is to activate your My Chart account. Instructions are located on the last page of this paperwork. If you have not heard from us regarding the results in 2 weeks, please contact this office.     Hypertension Hypertension, commonly called high blood pressure, is when the force of blood pumping through the arteries is too strong. The arteries are the blood vessels that carry blood from the heart throughout the body. Hypertension forces the heart to work harder to pump blood and may cause arteries to become narrow or stiff. Having untreated or uncontrolled hypertension can cause heart attacks, strokes, kidney disease, and other problems. A blood pressure reading consists of  a higher number over a lower number. Ideally, your blood pressure should be below 120/80. The first ("top") number is called the systolic pressure. It is a measure of the pressure in your arteries as your heart beats. The second ("bottom") number is called the diastolic pressure. It is a measure of the pressure in your arteries as the heart relaxes. What are the causes? The cause of this condition is not known. What increases the risk? Some risk factors for high blood pressure are under your control. Others are not. Factors you can change  Smoking.  Having type 2 diabetes mellitus, high cholesterol, or both.  Not getting enough exercise or physical activity.  Being overweight.  Having too much fat, sugar, calories, or salt (sodium) in your diet.  Drinking too much alcohol. Factors that are difficult or impossible to change  Having chronic kidney disease.  Having a family history of high blood pressure.  Age. Risk increases with age.  Race. You may be at higher risk if you are African-American.  Gender. Men are at higher risk than women before age 39. After age 365, women are at higher risk than men.  Having obstructive sleep apnea.  Stress. What are the signs or symptoms? Extremely high blood pressure (hypertensive crisis) may cause:  Headache.  Anxiety.  Shortness of breath.  Nosebleed.  Nausea and vomiting.  Severe chest pain.  Jerky movements you cannot control (seizures).  How is this diagnosed? This condition is diagnosed by measuring your blood pressure while you are seated, with your arm resting on a surface. The cuff of the blood pressure monitor will be placed directly against the skin of your upper arm at the level of your heart. It should be measured at least twice using the same arm. Certain conditions can cause a difference in blood pressure between your right and left arms. Certain factors can cause blood pressure readings to be lower or higher than  normal (elevated) for a short period of time:  When your blood pressure is higher when you are in a health care provider's office than when you are at home, this is called white coat hypertension. Most people with this condition do not need medicines.  When your blood pressure is higher at home than when you are in a health care provider's office, this is called masked hypertension. Most people with this condition may need medicines to control  blood pressure.  If you have a high blood pressure reading during one visit or you have normal blood pressure with other risk factors:  You may be asked to return on a different day to have your blood pressure checked again.  You may be asked to monitor your blood pressure at home for 1 week or longer.  If you are diagnosed with hypertension, you may have other blood or imaging tests to help your health care provider understand your overall risk for other conditions. How is this treated? This condition is treated by making healthy lifestyle changes, such as eating healthy foods, exercising more, and reducing your alcohol intake. Your health care provider may prescribe medicine if lifestyle changes are not enough to get your blood pressure under control, and if:  Your systolic blood pressure is above 130.  Your diastolic blood pressure is above 80.  Your personal target blood pressure may vary depending on your medical conditions, your age, and other factors. Follow these instructions at home: Eating and drinking  Eat a diet that is high in fiber and potassium, and low in sodium, added sugar, and fat. An example eating plan is called the DASH (Dietary Approaches to Stop Hypertension) diet. To eat this way: ? Eat plenty of fresh fruits and vegetables. Try to fill half of your plate at each meal with fruits and vegetables. ? Eat whole grains, such as whole wheat pasta, brown rice, or whole grain bread. Fill about one quarter of your plate with whole  grains. ? Eat or drink low-fat dairy products, such as skim milk or low-fat yogurt. ? Avoid fatty cuts of meat, processed or cured meats, and poultry with skin. Fill about one quarter of your plate with lean proteins, such as fish, chicken without skin, beans, eggs, and tofu. ? Avoid premade and processed foods. These tend to be higher in sodium, added sugar, and fat.  Reduce your daily sodium intake. Most people with hypertension should eat less than 1,500 mg of sodium a day.  Limit alcohol intake to no more than 1 drink a day for nonpregnant women and 2 drinks a day for men. One drink equals 12 oz of beer, 5 oz of wine, or 1 oz of hard liquor. Lifestyle  Work with your health care provider to maintain a healthy body weight or to lose weight. Ask what an ideal weight is for you.  Get at least 30 minutes of exercise that causes your heart to beat faster (aerobic exercise) most days of the week. Activities may include walking, swimming, or biking.  Include exercise to strengthen your muscles (resistance exercise), such as pilates or lifting weights, as part of your weekly exercise routine. Try to do these types of exercises for 30 minutes at least 3 days a week.  Do not use any products that contain nicotine or tobacco, such as cigarettes and e-cigarettes. If you need help quitting, ask your health care provider.  Monitor your blood pressure at home as told by your health care provider.  Keep all follow-up visits as told by your health care provider. This is important. Medicines  Take over-the-counter and prescription medicines only as told by your health care provider. Follow directions carefully. Blood pressure medicines must be taken as prescribed.  Do not skip doses of blood pressure medicine. Doing this puts you at risk for problems and can make the medicine less effective.  Ask your health care provider about side effects or reactions to medicines that you should watch  for. Contact  a health care provider if:  You think you are having a reaction to a medicine you are taking.  You have headaches that keep coming back (recurring).  You feel dizzy.  You have swelling in your ankles.  You have trouble with your vision. Get help right away if:  You develop a severe headache or confusion.  You have unusual weakness or numbness.  You feel faint.  You have severe pain in your chest or abdomen.  You vomit repeatedly.  You have trouble breathing. Summary  Hypertension is when the force of blood pumping through your arteries is too strong. If this condition is not controlled, it may put you at risk for serious complications.  Your personal target blood pressure may vary depending on your medical conditions, your age, and other factors. For most people, a normal blood pressure is less than 120/80.  Hypertension is treated with lifestyle changes, medicines, or a combination of both. Lifestyle changes include weight loss, eating a healthy, low-sodium diet, exercising more, and limiting alcohol. This information is not intended to replace advice given to you by your health care provider. Make sure you discuss any questions you have with your health care provider. Document Released: 06/25/2005 Document Revised: 05/23/2016 Document Reviewed: 05/23/2016 Elsevier Interactive Patient Education  2018 Elsevier Inc.      Edwina Barth, MD [facility]

## 2017-08-30 NOTE — Patient Instructions (Addendum)
   IF you received an x-ray today, you will receive an invoice from [facility]. Please contact [facility] at [phone] with questions or concerns regarding your invoice.   IF you received labwork today, you will receive an invoice from [facility]. Please contact [facility] at [phone] with questions or concerns regarding your invoice.   Our billing staff will not be able to assist you with questions regarding bills from these companies.  You will be contacted with the lab results as soon as they are available. The fastest way to get your results is to activate your My Chart account. Instructions are located on the last page of this paperwork. If you have not heard from us regarding the results in 2 weeks, please contact this office.     Hypertension Hypertension, commonly called high blood pressure, is when the force of blood pumping through the arteries is too strong. The arteries are the blood vessels that carry blood from the heart throughout the body. Hypertension forces the heart to work harder to pump blood and may cause arteries to become narrow or stiff. Having untreated or uncontrolled hypertension can cause heart attacks, strokes, kidney disease, and other problems. A blood pressure reading consists of a higher number over a lower number. Ideally, your blood pressure should be below 120/80. The first ("top") number is called the systolic pressure. It is a measure of the pressure in your arteries as your heart beats. The second ("bottom") number is called the diastolic pressure. It is a measure of the pressure in your arteries as the heart relaxes. What are the causes? The cause of this condition is not known. What increases the risk? Some risk factors for high blood pressure are under your control. Others are not. Factors you can change  Smoking.  Having type 2 diabetes mellitus, high cholesterol, or both.  Not getting enough exercise or physical  activity.  Being overweight.  Having too much fat, sugar, calories, or salt (sodium) in your diet.  Drinking too much alcohol. Factors that are difficult or impossible to change  Having chronic kidney disease.  Having a family history of high blood pressure.  Age. Risk increases with age.  Race. You may be at higher risk if you are African-American.  Gender. Men are at higher risk than women before age 45. After age 65, women are at higher risk than men.  Having obstructive sleep apnea.  Stress. What are the signs or symptoms? Extremely high blood pressure (hypertensive crisis) may cause:  Headache.  Anxiety.  Shortness of breath.  Nosebleed.  Nausea and vomiting.  Severe chest pain.  Jerky movements you cannot control (seizures).  How is this diagnosed? This condition is diagnosed by measuring your blood pressure while you are seated, with your arm resting on a surface. The cuff of the blood pressure monitor will be placed directly against the skin of your upper arm at the level of your heart. It should be measured at least twice using the same arm. Certain conditions can cause a difference in blood pressure between your right and left arms. Certain factors can cause blood pressure readings to be lower or higher than normal (elevated) for a short period of time:  When your blood pressure is higher when you are in a health care provider's office than when you are at home, this is called white coat hypertension. Most people with this condition do not need medicines.  When your blood pressure is higher at home than when you   are in a health care provider's office, this is called masked hypertension. Most people with this condition may need medicines to control blood pressure.  If you have a high blood pressure reading during one visit or you have normal blood pressure with other risk factors:  You may be asked to return on a different day to have your blood pressure  checked again.  You may be asked to monitor your blood pressure at home for 1 week or longer.  If you are diagnosed with hypertension, you may have other blood or imaging tests to help your health care provider understand your overall risk for other conditions. How is this treated? This condition is treated by making healthy lifestyle changes, such as eating healthy foods, exercising more, and reducing your alcohol intake. Your health care provider may prescribe medicine if lifestyle changes are not enough to get your blood pressure under control, and if:  Your systolic blood pressure is above 130.  Your diastolic blood pressure is above 80.  Your personal target blood pressure may vary depending on your medical conditions, your age, and other factors. Follow these instructions at home: Eating and drinking  Eat a diet that is high in fiber and potassium, and low in sodium, added sugar, and fat. An example eating plan is called the DASH (Dietary Approaches to Stop Hypertension) diet. To eat this way: ? Eat plenty of fresh fruits and vegetables. Try to fill half of your plate at each meal with fruits and vegetables. ? Eat whole grains, such as whole wheat pasta, brown rice, or whole grain bread. Fill about one quarter of your plate with whole grains. ? Eat or drink low-fat dairy products, such as skim milk or low-fat yogurt. ? Avoid fatty cuts of meat, processed or cured meats, and poultry with skin. Fill about one quarter of your plate with lean proteins, such as fish, chicken without skin, beans, eggs, and tofu. ? Avoid premade and processed foods. These tend to be higher in sodium, added sugar, and fat.  Reduce your daily sodium intake. Most people with hypertension should eat less than 1,500 mg of sodium a day.  Limit alcohol intake to no more than 1 drink a day for nonpregnant women and 2 drinks a day for men. One drink equals 12 oz of beer, 5 oz of wine, or 1 oz of hard  liquor. Lifestyle  Work with your health care provider to maintain a healthy body weight or to lose weight. Ask what an ideal weight is for you.  Get at least 30 minutes of exercise that causes your heart to beat faster (aerobic exercise) most days of the week. Activities may include walking, swimming, or biking.  Include exercise to strengthen your muscles (resistance exercise), such as pilates or lifting weights, as part of your weekly exercise routine. Try to do these types of exercises for 30 minutes at least 3 days a week.  Do not use any products that contain nicotine or tobacco, such as cigarettes and e-cigarettes. If you need help quitting, ask your health care provider.  Monitor your blood pressure at home as told by your health care provider.  Keep all follow-up visits as told by your health care provider. This is important. Medicines  Take over-the-counter and prescription medicines only as told by your health care provider. Follow directions carefully. Blood pressure medicines must be taken as prescribed.  Do not skip doses of blood pressure medicine. Doing this puts you at risk for problems and   can make the medicine less effective.  Ask your health care provider about side effects or reactions to medicines that you should watch for. Contact a health care provider if:  You think you are having a reaction to a medicine you are taking.  You have headaches that keep coming back (recurring).  You feel dizzy.  You have swelling in your ankles.  You have trouble with your vision. Get help right away if:  You develop a severe headache or confusion.  You have unusual weakness or numbness.  You feel faint.  You have severe pain in your chest or abdomen.  You vomit repeatedly.  You have trouble breathing. Summary  Hypertension is when the force of blood pumping through your arteries is too strong. If this condition is not controlled, it may put you at risk for serious  complications.  Your personal target blood pressure may vary depending on your medical conditions, your age, and other factors. For most people, a normal blood pressure is less than 120/80.  Hypertension is treated with lifestyle changes, medicines, or a combination of both. Lifestyle changes include weight loss, eating a healthy, low-sodium diet, exercising more, and limiting alcohol. This information is not intended to replace advice given to you by your health care provider. Make sure you discuss any questions you have with your health care provider. Document Released: 06/25/2005 Document Revised: 05/23/2016 Document Reviewed: 05/23/2016 Elsevier Interactive Patient Education  2018 Elsevier Inc.  

## 2017-08-31 ENCOUNTER — Other Ambulatory Visit: Payer: Self-pay | Admitting: Emergency Medicine

## 2017-08-31 LAB — CBC WITH DIFFERENTIAL/PLATELET
BASOS: 0 %
Basophils Absolute: 0 10*3/uL (ref 0.0–0.2)
EOS (ABSOLUTE): 0.2 10*3/uL (ref 0.0–0.4)
EOS: 3 %
HEMATOCRIT: 40.8 % (ref 37.5–51.0)
Hemoglobin: 13.7 g/dL (ref 13.0–17.7)
IMMATURE GRANULOCYTES: 0 %
Immature Grans (Abs): 0 10*3/uL (ref 0.0–0.1)
LYMPHS ABS: 2.7 10*3/uL (ref 0.7–3.1)
Lymphs: 35 %
MCH: 30.1 pg (ref 26.6–33.0)
MCHC: 33.6 g/dL (ref 31.5–35.7)
MCV: 90 fL (ref 79–97)
MONOS ABS: 1 10*3/uL — AB (ref 0.1–0.9)
Monocytes: 13 %
NEUTROS PCT: 49 %
Neutrophils Absolute: 3.9 10*3/uL (ref 1.4–7.0)
Platelets: 377 10*3/uL (ref 150–379)
RBC: 4.55 x10E6/uL (ref 4.14–5.80)
RDW: 14.6 % (ref 12.3–15.4)
WBC: 7.8 10*3/uL (ref 3.4–10.8)

## 2017-08-31 LAB — COMPREHENSIVE METABOLIC PANEL
A/G RATIO: 1.3 (ref 1.2–2.2)
ALT: 11 IU/L (ref 0–44)
AST: 10 IU/L (ref 0–40)
Albumin: 4.3 g/dL (ref 3.5–5.5)
Alkaline Phosphatase: 67 IU/L (ref 39–117)
BUN / CREAT RATIO: 12 (ref 9–20)
BUN: 16 mg/dL (ref 6–20)
Bilirubin Total: <0.2 mg/dL (ref 0.0–1.2)
CALCIUM: 9.6 mg/dL (ref 8.7–10.2)
CO2: 16 mmol/L — ABNORMAL LOW (ref 20–29)
Chloride: 111 mmol/L — ABNORMAL HIGH (ref 96–106)
Creatinine, Ser: 1.39 mg/dL — ABNORMAL HIGH (ref 0.76–1.27)
GFR, EST AFRICAN AMERICAN: 74 mL/min/{1.73_m2} (ref >59)
GFR, EST NON AFRICAN AMERICAN: 64 mL/min/{1.73_m2} (ref >59)
GLOBULIN, TOTAL: 3.3 g/dL (ref 1.5–4.5)
Glucose: 142 mg/dL — ABNORMAL HIGH (ref 65–99)
POTASSIUM: 4.2 mmol/L (ref 3.5–5.2)
Sodium: 142 mmol/L (ref 134–144)
Total Protein: 7.6 g/dL (ref 6.0–8.5)

## 2017-08-31 LAB — HEMOGLOBIN A1C
ESTIMATED AVERAGE GLUCOSE: 206 mg/dL
Hgb A1c MFr Bld: 8.8 % — ABNORMAL HIGH (ref 4.8–5.6)

## 2017-08-31 MED ORDER — METFORMIN HCL 500 MG PO TABS: 500.0000 mg | ORAL_TABLET | Freq: Two times a day (BID) | ORAL | 3 refills | Status: DC

## 2017-09-03 ENCOUNTER — Encounter: Payer: Self-pay | Admitting: Radiology

## 2017-09-05 ENCOUNTER — Telehealth: Payer: Self-pay | Admitting: Emergency Medicine

## 2017-09-05 DIAGNOSIS — G932 Benign intracranial hypertension: Secondary | ICD-10-CM

## 2017-09-05 NOTE — Telephone Encounter (Signed)
Copied from CRM [phone]. Topic: Quick Communication - See Telephone Encounter >> Sep 05, 2017  9:40 AM Lelon FrohlichGolden, Sherl Yzaguirre, RMA wrote: CRM for notification. See Telephone encounter for:   09/05/17.pt girlfriend Ms. Earlene PlaterDavis ([facility]called and stated that pt rx for acetazolamide 500 mg was sent incorrectly to pharmacy she stated that he takes one 500 mg tab twice a day please contact pharmacy to clear this up or contact pt for further clarification

## 2017-09-06 NOTE — Telephone Encounter (Signed)
Ms. William PlaterDavis Call back [phone] [facility] [address] [place]  Pt is out of medication and girlfriend is calling to f/u on RX again. She isn't sure if the pharmacy contacted office 08/30/17 but it was originally sent for 500mg  and this RX was sent for 2-250mg . Pt only wants to take one capsule. Please send today is her request.

## 2017-09-06 NOTE — Telephone Encounter (Signed)
Pt's girlfriend called and said its the 500mg  extended release.

## 2017-09-06 NOTE — Telephone Encounter (Signed)
°  Medication was called in as 2-250 mg tablets and he wants one capsule that is 500 mg.   He is out of medication and asking if can be done today  [facility] [address] - [place], [place] - [id number]  [address] [place] [id number]  Phone: [phone] Fax: [phone]   Please call Shanda BumpsJessica when done [phone]

## 2017-09-06 NOTE — Telephone Encounter (Signed)
Girlfriend called again - said that pt really need this medication - that the pt is in pain.  Please send in the 500 mg dose into the pharm -  cb for girlfriend is [phone]

## 2017-09-06 NOTE — Telephone Encounter (Signed)
Acetazolamide LOV: 08/30/17 PCP: Dr Alvy BimlerSagardia [facility] [address] [place]

## 2017-09-07 NOTE — Telephone Encounter (Signed)
Call back from pt's girlfriend, who is listed on ROI.  She states there are two issues with current Rx for Diamox.  First, patient had been on extended release and these are immediate release.  Also, was on one 500mg  capsule twice per day.  This new Rx is double the price.  Could it be changed to Acetazolamide ER 500mg  BID?

## 2017-09-07 NOTE — Telephone Encounter (Signed)
Contacted pharmacy.  States they only have 250mg  tablets.  Contacted distributor.  Not able to get 500mg  tablets at this time.  Advised pt.  Pt questioned Metformin, stating that he was contacted previously. Advised pt that Metformin has been sent to pharmacy.

## 2017-09-09 MED ORDER — ACETAZOLAMIDE 250 MG PO TABS: 500.0000 mg | ORAL_TABLET | Freq: Two times a day (BID) | ORAL | 3 refills | Status: DC

## 2017-09-09 NOTE — Telephone Encounter (Signed)
Order signed. Thanks.

## 2017-09-09 NOTE — Telephone Encounter (Signed)
Girlfriend, Jennette KettleDeanna (Day-nuh), calling because the script called in today is not an extended release capsule. She understands that the dose is 250mg  2 times a a day now, but patient needs the extended release capsule. Deanna would like a call back as soon as possible to make sure the right thing is being called in.

## 2017-09-10 NOTE — Telephone Encounter (Signed)
Find out exactly the dose and form of medication before we change anything here. Lets avoid any confusion as this was the first time I saw this patient.

## 2017-09-10 NOTE — Telephone Encounter (Signed)
Sagardia Please advise?

## 2017-09-11 NOTE — Telephone Encounter (Signed)
Left detailed message for exact dose and form of medication

## 2017-09-23 NOTE — Telephone Encounter (Signed)
Left another message for exact dose.

## 2017-09-30 NOTE — Telephone Encounter (Signed)
Thanks

## 2017-09-30 NOTE — Telephone Encounter (Signed)
Message left for exact dose .   Patient has a scheduled appointment 5/24.    Unable to get information needed for medication 3 messages left with no return call

## 2017-11-29 ENCOUNTER — Other Ambulatory Visit: Payer: Self-pay

## 2017-11-29 ENCOUNTER — Encounter: Payer: Self-pay | Admitting: Emergency Medicine

## 2017-11-29 ENCOUNTER — Ambulatory Visit (INDEPENDENT_AMBULATORY_CARE_PROVIDER_SITE_OTHER): Payer: Medicaid Other | Admitting: Emergency Medicine

## 2017-11-29 VITALS — BP 161/116 | HR 83 | Temp 98.1°F | Resp 18 | Ht 69.76 in | Wt 276.4 lb

## 2017-11-29 DIAGNOSIS — G932 Benign intracranial hypertension: Secondary | ICD-10-CM

## 2017-11-29 DIAGNOSIS — E118 Type 2 diabetes mellitus with unspecified complications: Secondary | ICD-10-CM | POA: Diagnosis not present

## 2017-11-29 DIAGNOSIS — E1165 Type 2 diabetes mellitus with hyperglycemia: Secondary | ICD-10-CM | POA: Diagnosis not present

## 2017-11-29 DIAGNOSIS — I10 Essential (primary) hypertension: Secondary | ICD-10-CM

## 2017-11-29 DIAGNOSIS — E785 Hyperlipidemia, unspecified: Secondary | ICD-10-CM | POA: Insufficient documentation

## 2017-11-29 LAB — GLUCOSE, POCT (MANUAL RESULT ENTRY): POC Glucose: >400 mg/dl (ref 70–99)

## 2017-11-29 LAB — POCT GLYCOSYLATED HEMOGLOBIN (HGB A1C): HEMOGLOBIN A1C: 12.5 % — AB (ref 4.0–5.6)

## 2017-11-29 MED ORDER — GLIPIZIDE 5 MG PO TABS: 5.0000 mg | ORAL_TABLET | Freq: Two times a day (BID) | ORAL | 1 refills | Status: DC

## 2017-11-29 MED ORDER — BLOOD GLUCOSE MONITOR KIT: PACK | 0 refills | Status: DC

## 2017-11-29 MED ORDER — CARVEDILOL 12.5 MG PO TABS: 12.5000 mg | ORAL_TABLET | Freq: Two times a day (BID) | ORAL | 3 refills | Status: DC

## 2017-11-29 MED ORDER — LISINOPRIL 10 MG PO TABS: 10.0000 mg | ORAL_TABLET | Freq: Every day | ORAL | 3 refills | Status: DC

## 2017-11-29 NOTE — Patient Instructions (Addendum)
   IF you received an x-ray today, you will receive an invoice from [facility]. Please contact [facility] at [phone] with questions or concerns regarding your invoice.   IF you received labwork today, you will receive an invoice from [facility]. Please contact [facility] at [phone] with questions or concerns regarding your invoice.   Our billing staff will not be able to assist you with questions regarding bills from these companies.  You will be contacted with the lab results as soon as they are available. The fastest way to get your results is to activate your My Chart account. Instructions are located on the last page of this paperwork. If you have not heard from us regarding the results in 2 weeks, please contact this office.     Diabetes Mellitus and Nutrition When you have diabetes (diabetes mellitus), it is very important to have healthy eating habits because your blood sugar (glucose) levels are greatly affected by what you eat and drink. Eating healthy foods in the appropriate amounts, at about the same times every day, can help you:  Control your blood glucose.  Lower your risk of heart disease.  Improve your blood pressure.  Reach or maintain a healthy weight.  Every person with diabetes is different, and each person has different needs for a meal plan. Your health care provider may recommend that you work with a diet and nutrition specialist (dietitian) to make a meal plan that is best for you. Your meal plan may vary depending on factors such as:  The calories you need.  The medicines you take.  Your weight.  Your blood glucose, blood pressure, and cholesterol levels.  Your activity level.  Other health conditions you have, such as heart or kidney disease.  How do carbohydrates affect me? Carbohydrates affect your blood glucose level more than any other type of food. Eating carbohydrates naturally increases the amount of glucose in your  blood. Carbohydrate counting is a method for keeping track of how many carbohydrates you eat. Counting carbohydrates is important to keep your blood glucose at a healthy level, especially if you use insulin or take certain oral diabetes medicines. It is important to know how many carbohydrates you can safely have in each meal. This is different for every person. Your dietitian can help you calculate how many carbohydrates you should have at each meal and for snack. Foods that contain carbohydrates include:  Bread, cereal, rice, pasta, and crackers.  Potatoes and corn.  Peas, beans, and lentils.  Milk and yogurt.  Fruit and juice.  Desserts, such as cakes, cookies, ice cream, and candy.  How does alcohol affect me? Alcohol can cause a sudden decrease in blood glucose (hypoglycemia), especially if you use insulin or take certain oral diabetes medicines. Hypoglycemia can be a life-threatening condition. Symptoms of hypoglycemia (sleepiness, dizziness, and confusion) are similar to symptoms of having too much alcohol. If your health care provider says that alcohol is safe for you, follow these guidelines:  Limit alcohol intake to no more than 1 drink per day for nonpregnant women and 2 drinks per day for men. One drink equals 12 oz of beer, 5 oz of wine, or 1 oz of hard liquor.  Do not drink on an empty stomach.  Keep yourself hydrated with water, diet soda, or unsweetened iced tea.  Keep in mind that regular soda, juice, and other mixers may contain a lot of sugar and must be counted as carbohydrates.  What are tips for following   this plan? Reading food labels  Start by checking the serving size on the label. The amount of calories, carbohydrates, fats, and other nutrients listed on the label are based on one serving of the food. Many foods contain more than one serving per package.  Check the total grams (g) of carbohydrates in one serving. You can calculate the number of servings of  carbohydrates in one serving by dividing the total carbohydrates by 15. For example, if a food has 30 g of total carbohydrates, it would be equal to 2 servings of carbohydrates.  Check the number of grams (g) of saturated and trans fats in one serving. Choose foods that have low or no amount of these fats.  Check the number of milligrams (mg) of sodium in one serving. Most people should limit total sodium intake to less than 2,300 mg per day.  Always check the nutrition information of foods labeled as "low-fat" or "nonfat". These foods may be higher in added sugar or refined carbohydrates and should be avoided.  Talk to your dietitian to identify your daily goals for nutrients listed on the label. Shopping  Avoid buying canned, premade, or processed foods. These foods tend to be high in fat, sodium, and added sugar.  Shop around the outside edge of the grocery store. This includes fresh fruits and vegetables, bulk grains, fresh meats, and fresh dairy. Cooking  Use low-heat cooking methods, such as baking, instead of high-heat cooking methods like deep frying.  Cook using healthy oils, such as olive, canola, or sunflower oil.  Avoid cooking with butter, cream, or high-fat meats. Meal planning  Eat meals and snacks regularly, preferably at the same times every day. Avoid going long periods of time without eating.  Eat foods high in fiber, such as fresh fruits, vegetables, beans, and whole grains. Talk to your dietitian about how many servings of carbohydrates you can eat at each meal.  Eat 4-6 ounces of lean protein each day, such as lean meat, chicken, fish, eggs, or tofu. 1 ounce is equal to 1 ounce of meat, chicken, or fish, 1 egg, or 1/4 cup of tofu.  Eat some foods each day that contain healthy fats, such as avocado, nuts, seeds, and fish. Lifestyle   Check your blood glucose regularly.  Exercise at least 30 minutes 5 or more days each week, or as told by your health care  provider.  Take medicines as told by your health care provider.  Do not use any products that contain nicotine or tobacco, such as cigarettes and e-cigarettes. If you need help quitting, ask your health care provider.  Work with a Social worker or diabetes educator to identify strategies to manage stress and any emotional and social challenges. What are some questions to ask my health care provider?  Do I need to meet with a diabetes educator?  Do I need to meet with a dietitian?  What number can I call if I have questions?  When are the best times to check my blood glucose? Where to find more information:  American Diabetes Association: diabetes.org/food-and-fitness/food  Academy of Nutrition and Dietetics: PokerClues.dk  [employer] of Diabetes and Digestive and Kidney Diseases (NIH): ContactWire.be Summary  A healthy meal plan will help you control your blood glucose and maintain a healthy lifestyle.  Working with a diet and nutrition specialist (dietitian) can help you make a meal plan that is best for you.  Keep in mind that carbohydrates and alcohol have immediate effects on your blood glucose  levels. It is important to count carbohydrates and to use alcohol carefully. This information is not intended to replace advice given to you by your health care provider. Make sure you discuss any questions you have with your health care provider. Document Released: 03/22/2005 Document Revised: 07/30/2016 Document Reviewed: 07/30/2016 Elsevier Interactive Patient Education  2018 [employer].  Hypertension Hypertension, commonly called high blood pressure, is when the force of blood pumping through the arteries is too strong. The arteries are the blood vessels that carry blood from the heart throughout the body. Hypertension forces the heart to work harder to  pump blood and may cause arteries to become narrow or stiff. Having untreated or uncontrolled hypertension can cause heart attacks, strokes, kidney disease, and other problems. A blood pressure reading consists of a higher number over a lower number. Ideally, your blood pressure should be below 120/80. The first ("top") number is called the systolic pressure. It is a measure of the pressure in your arteries as your heart beats. The second ("bottom") number is called the diastolic pressure. It is a measure of the pressure in your arteries as the heart relaxes. What are the causes? The cause of this condition is not known. What increases the risk? Some risk factors for high blood pressure are under your control. Others are not. Factors you can change  Smoking.  Having type 2 diabetes mellitus, high cholesterol, or both.  Not getting enough exercise or physical activity.  Being overweight.  Having too much fat, sugar, calories, or salt (sodium) in your diet.  Drinking too much alcohol. Factors that are difficult or impossible to change  Having chronic kidney disease.  Having a family history of high blood pressure.  Age. Risk increases with age.  Race. You may be at higher risk if you are African-American.  Gender. Men are at higher risk than women before age 25. After age 78, women are at higher risk than men.  Having obstructive sleep apnea.  Stress. What are the signs or symptoms? Extremely high blood pressure (hypertensive crisis) may cause:  Headache.  Anxiety.  Shortness of breath.  Nosebleed.  Nausea and vomiting.  Severe chest pain.  Jerky movements you cannot control (seizures).  How is this diagnosed? This condition is diagnosed by measuring your blood pressure while you are seated, with your arm resting on a surface. The cuff of the blood pressure monitor will be placed directly against the skin of your upper arm at the level of your heart. It should be  measured at least twice using the same arm. Certain conditions can cause a difference in blood pressure between your right and left arms. Certain factors can cause blood pressure readings to be lower or higher than normal (elevated) for a short period of time:  When your blood pressure is higher when you are in a health care provider's office than when you are at home, this is called white coat hypertension. Most people with this condition do not need medicines.  When your blood pressure is higher at home than when you are in a health care provider's office, this is called masked hypertension. Most people with this condition may need medicines to control blood pressure.  If you have a high blood pressure reading during one visit or you have normal blood pressure with other risk factors:  You may be asked to return on a different day to have your blood pressure checked again.  You may be asked to monitor your blood pressure at  home for 1 week or longer.  If you are diagnosed with hypertension, you may have other blood or imaging tests to help your health care provider understand your overall risk for other conditions. How is this treated? This condition is treated by making healthy lifestyle changes, such as eating healthy foods, exercising more, and reducing your alcohol intake. Your health care provider may prescribe medicine if lifestyle changes are not enough to get your blood pressure under control, and if:  Your systolic blood pressure is above 130.  Your diastolic blood pressure is above 80.  Your personal target blood pressure may vary depending on your medical conditions, your age, and other factors. Follow these instructions at home: Eating and drinking  Eat a diet that is high in fiber and potassium, and low in sodium, added sugar, and fat. An example eating plan is called the DASH (Dietary Approaches to Stop Hypertension) diet. To eat this way: ? Eat plenty of fresh fruits and  vegetables. Try to fill half of your plate at each meal with fruits and vegetables. ? Eat whole grains, such as whole wheat pasta, brown rice, or whole grain bread. Fill about one quarter of your plate with whole grains. ? Eat or drink low-fat dairy products, such as skim milk or low-fat yogurt. ? Avoid fatty cuts of meat, processed or cured meats, and poultry with skin. Fill about one quarter of your plate with lean proteins, such as fish, chicken without skin, beans, eggs, and tofu. ? Avoid premade and processed foods. These tend to be higher in sodium, added sugar, and fat.  Reduce your daily sodium intake. Most people with hypertension should eat less than 1,500 mg of sodium a day.  Limit alcohol intake to no more than 1 drink a day for nonpregnant women and 2 drinks a day for men. One drink equals 12 oz of beer, 5 oz of wine, or 1 oz of hard liquor. Lifestyle  Work with your health care provider to maintain a healthy body weight or to lose weight. Ask what an ideal weight is for you.  Get at least 30 minutes of exercise that causes your heart to beat faster (aerobic exercise) most days of the week. Activities may include walking, swimming, or biking.  Include exercise to strengthen your muscles (resistance exercise), such as pilates or lifting weights, as part of your weekly exercise routine. Try to do these types of exercises for 30 minutes at least 3 days a week.  Do not use any products that contain nicotine or tobacco, such as cigarettes and e-cigarettes. If you need help quitting, ask your health care provider.  Monitor your blood pressure at home as told by your health care provider.  Keep all follow-up visits as told by your health care provider. This is important. Medicines  Take over-the-counter and prescription medicines only as told by your health care provider. Follow directions carefully. Blood pressure medicines must be taken as prescribed.  Do not skip doses of blood  pressure medicine. Doing this puts you at risk for problems and can make the medicine less effective.  Ask your health care provider about side effects or reactions to medicines that you should watch for. Contact a health care provider if:  You think you are having a reaction to a medicine you are taking.  You have headaches that keep coming back (recurring).  You feel dizzy.  You have swelling in your ankles.  You have trouble with your vision. Get help right away  if:  You develop a severe headache or confusion.  You have unusual weakness or numbness.  You feel faint.  You have severe pain in your chest or abdomen.  You vomit repeatedly.  You have trouble breathing. Summary  Hypertension is when the force of blood pumping through your arteries is too strong. If this condition is not controlled, it may put you at risk for serious complications.  Your personal target blood pressure may vary depending on your medical conditions, your age, and other factors. For most people, a normal blood pressure is less than 120/80.  Hypertension is treated with lifestyle changes, medicines, or a combination of both. Lifestyle changes include weight loss, eating a healthy, low-sodium diet, exercising more, and limiting alcohol. This information is not intended to replace advice given to you by your health care provider. Make sure you discuss any questions you have with your health care provider. Document Released: 06/25/2005 Document Revised: 05/23/2016 Document Reviewed: 05/23/2016 Elsevier Interactive Patient Education  [employer].

## 2017-11-29 NOTE — Progress Notes (Signed)
William Zuniga 37 y.o.   Chief Complaint  Patient presents with  . Hypertension    f/u    HISTORY OF PRESENT ILLNESS: This is a 37 y.o. male here for follow-up of hypertension.  Seen by me 08/30/2017 for the first time.  Blood work revealed diabetes.  Was initially prescribed metformin but patient not taking due to interaction with Diamox.  Legally blind.  Compliant with medications.  Blood pressures at home as per wife 170s over 112.  Patient has no complaints.  Asymptomatic.  HPI   Prior to Admission medications   Medication Sig Start Date End Date Taking? Authorizing Provider  acetaminophen (TYLENOL) 500 MG tablet Take 2 tablets (1,000 mg total) by mouth every 6 (six) hours as needed for mild pain, moderate pain or fever. 07/30/17  Yes Rice, Resa Miner, MD  acetaZOLAMIDE (DIAMOX) 250 MG tablet Take 2 tablets (500 mg total) by mouth 2 (two) times daily. 09/09/17 12/08/17 Yes Chinara Hertzberg, Ines Bloomer, MD  metFORMIN (GLUCOPHAGE) 500 MG tablet Take 1 tablet (500 mg total) by mouth 2 (two) times daily with a meal. 08/31/17  Yes Alyssa Mancera, Ines Bloomer, MD  amLODipine (NORVASC) 10 MG tablet Take 1 tablet (10 mg total) by mouth daily. 08/30/17 11/28/17  Horald Pollen, MD  carvedilol (COREG) 6.25 MG tablet Take 1 tablet (6.25 mg total) by mouth every 12 (twelve) hours. 08/30/17 11/28/17  Horald Pollen, MD  hydrALAZINE (APRESOLINE) 50 MG tablet Take 1 tablet (50 mg total) by mouth every 8 (eight) hours. Patient not taking: Reported on 11/29/2017 07/31/17   Collier Salina, MD  insulin aspart (NOVOLOG) 100 UNIT/ML injection Inject 0-9 Units into the skin 3 (three) times daily with meals. Patient not taking: Reported on 08/30/2017 07/31/17   Collier Salina, MD  insulin aspart (NOVOLOG) 100 UNIT/ML injection Inject 0-5 Units into the skin at bedtime. Patient not taking: Reported on 08/30/2017 07/31/17   Collier Salina, MD  labetalol (NORMODYNE,TRANDATE) 5 MG/ML injection Inject 2 mLs  (10 mg total) into the vein every 6 (six) hours as needed (SBP > 160). Patient not taking: Reported on 08/30/2017 07/30/17   Collier Salina, MD  lisinopril (PRINIVIL,ZESTRIL) 5 MG tablet Take 1 tablet (5 mg total) by mouth daily. 08/30/17 11/28/17  Horald Pollen, MD  ondansetron (ZOFRAN) 4 MG tablet Take 1 tablet (4 mg total) by mouth every 8 (eight) hours as needed for nausea or vomiting. Patient not taking: Reported on 08/30/2017 07/30/17   Collier Salina, MD  potassium chloride (K-DUR,KLOR-CON) 10 MEQ tablet Take 1 tablet (10 mEq total) by mouth 2 (two) times daily. 08/30/17 11/28/17  Horald Pollen, MD    No Known Allergies  Patient Active Problem List   Diagnosis Date Noted  . Essential hypertension, malignant 08/30/2017  . Idiopathic intracranial hypertension 08/30/2017  . History of hypokalemia 08/30/2017  . Hypertensive crisis 07/26/2017  . 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 PRES (posterior reversible encephalopathy syndrome) 07/26/2017  . MODY (maturity onset diabetes mellitus in young) ([place]) 07/26/2017    Past Medical History:  Diagnosis Date  . Diabetes mellitus without complication ([place])   . Hypertension     History reviewed. No pertinent surgical history.  Social History   Socioeconomic History  . Marital status: Married    Spouse name: Not on file  . Number of children: 7  . Years of education: Not on file  . Highest education level: Not on file  Occupational History  . Not on file  Social Needs  . Financial resource strain: Not on file  . Food insecurity:    Worry: Not on file    Inability: Not on file  . Transportation needs:    Medical: Not on file    Non-medical: Not on file  Tobacco Use  . Smoking status: Never Smoker  . Smokeless tobacco: Never Used  Substance and Sexual Activity  . Alcohol use: No    Frequency: Never  . Drug use: No  . Sexual activity: Yes  Lifestyle  . Physical activity:    Days per week: Not on file    Minutes  per session: Not on file  . Stress: Not on file  Relationships  . Social connections:    Talks on phone: Not on file    Gets together: Not on file    Attends religious service: Not on file    Active member of club or organization: Not on file    Attends meetings of clubs or organizations: Not on file    Relationship status: Not on file  . Intimate partner violence:    Fear of current or ex partner: Not on file    Emotionally abused: Not on file    Physically abused: Not on file    Forced sexual activity: Not on file  Other Topics Concern  . Not on file  Social History Narrative  . Not on file    History reviewed. No pertinent family history.   Review of Systems  Constitutional: Negative.  Negative for fever and weight loss.  HENT: Negative.  Negative for hearing loss and nosebleeds.   Eyes: Positive for blurred vision (Legally blind).  Respiratory: Negative.  Negative for cough and shortness of breath.   Cardiovascular: Negative.  Negative for chest pain and palpitations.  Gastrointestinal: Negative.  Negative for abdominal pain, nausea and vomiting.  Genitourinary: Negative.  Negative for dysuria and hematuria.  Musculoskeletal: Negative.  Negative for back pain, myalgias and neck pain.  Skin: Negative.  Negative for rash.  Neurological: Negative.  Negative for dizziness and headaches.  Endo/Heme/Allergies: Negative.     Vitals:   11/29/17 1706 11/29/17 1739  BP: (!) 165/114 (!) 161/116  Pulse: 83   Resp: 18   Temp: 98.1 F (36.7 C)   SpO2: 96%     Physical Exam  Constitutional: He is oriented to person, place, and time. He appears well-developed and well-nourished.  HENT:  Head: Normocephalic and atraumatic.  Nose: Nose normal.  Mouth/Throat: Oropharynx is clear and moist.  Eyes: Conjunctivae are normal.  Neck: Normal range of motion. Neck supple. No JVD present.  Cardiovascular: Normal rate, regular rhythm and normal heart sounds.  Pulmonary/Chest: Effort  normal and breath sounds normal.  Abdominal: Soft. There is no tenderness.  Musculoskeletal: Normal range of motion. He exhibits no edema.  Neurological: He is alert and oriented to person, place, and time. No sensory deficit. He exhibits normal muscle tone.  Skin: Skin is warm and dry. Capillary refill takes less than 2 seconds.  Psychiatric: He has a normal mood and affect. His behavior is normal.  Vitals reviewed.  Type 2 diabetes mellitus with hyperglycemia, without long-term current use of insulin (HCC) Hemoglobin A1c elevated.  Higher than before.  Patient was not taking metformin because of interaction with Diamox.  Will start glipizide 5 mg twice a day.  Follow-up in 1 month.  Continue diabetic diet.  Glucometer prescribed and advised to check his sugars twice a day.  Essential hypertension, malignant Blood  pressure persistently elevated.  Asymptomatic.  Will increase lisinopril to 10 mg a day.  Will increase carvedilol to 12.5 mg twice a day.  Continue amlodipine 10 mg a day.  Continue Diamox thousand milligrams a day.  Follow-up in 1 month.    ASSESSMENT & PLAN: Khadir was seen today for hypertension.  Diagnoses and all orders for this visit:  Type 2 diabetes mellitus with hyperglycemia, without long-term current use of insulin (HCC) -     Basic metabolic panel -     POCT glycosylated hemoglobin (Hb A1C) -     POCT glucose (manual entry) -     glipiZIDE (GLUCOTROL) 5 MG tablet; Take 1 tablet (5 mg total) by mouth 2 (two) times daily before a meal. -     blood glucose meter kit and supplies KIT; Dispense based on patient and insurance preference. Use up to four times daily as directed. (FOR ICD-9 250.00, 250.01).  Essential hypertension, malignant  Idiopathic intracranial hypertension  Diabetic feet ([place]) -     Ambulatory referral to Podiatry  Other orders -     carvedilol (COREG) 12.5 MG tablet; Take 1 tablet (12.5 mg total) by mouth 2 (two) times daily with a meal. -      lisinopril (PRINIVIL,ZESTRIL) 10 MG tablet; Take 1 tablet (10 mg total) by mouth daily.    Patient Instructions       IF you received an x-ray today, you will receive an invoice from [facility]. Please contact [facility] at [phone] with questions or concerns regarding your invoice.   IF you received labwork today, you will receive an invoice from [facility]. Please contact [facility] at [phone] with questions or concerns regarding your invoice.   Our billing staff will not be able to assist you with questions regarding bills from these companies.  You will be contacted with the lab results as soon as they are available. The fastest way to get your results is to activate your My Chart account. Instructions are located on the last page of this paperwork. If you have not heard from [place] regarding the results in 2 weeks, please contact this office.     Diabetes Mellitus and Nutrition When you have diabetes (diabetes mellitus), it is very important to have healthy eating habits because your blood sugar (glucose) levels are greatly affected by what you eat and drink. Eating healthy foods in the appropriate amounts, at about the same times every day, can help you:  Control your blood glucose.  Lower your risk of heart disease.  Improve your blood pressure.  Reach or maintain a healthy weight.  Every person with diabetes is different, and each person has different needs for a meal plan. Your health care provider may recommend that you work with a diet and nutrition specialist (dietitian) to make a meal plan that is best for you. Your meal plan may vary depending on factors such as:  The calories you need.  The medicines you take.  Your weight.  Your blood glucose, blood pressure, and cholesterol levels.  Your activity level.  Other health conditions you have, such as heart or kidney disease.  How do carbohydrates affect me? Carbohydrates affect your  blood glucose level more than any other type of food. Eating carbohydrates naturally increases the amount of glucose in your blood. Carbohydrate counting is a method for keeping track of how many carbohydrates you eat. Counting carbohydrates is important to keep your blood glucose at a healthy level, especially if you use  insulin or take certain oral diabetes medicines. It is important to know how many carbohydrates you can safely have in each meal. This is different for every person. Your dietitian can help you calculate how many carbohydrates you should have at each meal and for snack. Foods that contain carbohydrates include:  Bread, cereal, rice, pasta, and crackers.  Potatoes and corn.  Peas, beans, and lentils.  Milk and yogurt.  Fruit and juice.  Desserts, such as cakes, cookies, ice cream, and candy.  How does alcohol affect me? Alcohol can cause a sudden decrease in blood glucose (hypoglycemia), especially if you use insulin or take certain oral diabetes medicines. Hypoglycemia can be a life-threatening condition. Symptoms of hypoglycemia (sleepiness, dizziness, and confusion) are similar to symptoms of having too much alcohol. If your health care provider says that alcohol is safe for you, follow these guidelines:  Limit alcohol intake to no more than 1 drink per day for nonpregnant women and 2 drinks per day for men. One drink equals 12 oz of beer, 5 oz of wine, or 1 oz of hard liquor.  Do not drink on an empty stomach.  Keep yourself hydrated with water, diet soda, or unsweetened iced tea.  Keep in mind that regular soda, juice, and other mixers may contain a lot of sugar and must be counted as carbohydrates.  What are tips for following this plan? Reading food labels  Start by checking the serving size on the label. The amount of calories, carbohydrates, fats, and other nutrients listed on the label are based on one serving of the food. Many foods contain more than one  serving per package.  Check the total grams (g) of carbohydrates in one serving. You can calculate the number of servings of carbohydrates in one serving by dividing the total carbohydrates by 15. For example, if a food has 30 g of total carbohydrates, it would be equal to 2 servings of carbohydrates.  Check the number of grams (g) of saturated and trans fats in one serving. Choose foods that have low or no amount of these fats.  Check the number of milligrams (mg) of sodium in one serving. Most people should limit total sodium intake to less than 2,300 mg per day.  Always check the nutrition information of foods labeled as "low-fat" or "nonfat". These foods may be higher in added sugar or refined carbohydrates and should be avoided.  Talk to your dietitian to identify your daily goals for nutrients listed on the label. Shopping  Avoid buying canned, premade, or processed foods. These foods tend to be high in fat, sodium, and added sugar.  Shop around the outside edge of the grocery store. This includes fresh fruits and vegetables, bulk grains, fresh meats, and fresh dairy. Cooking  Use low-heat cooking methods, such as baking, instead of high-heat cooking methods like deep frying.  Cook using healthy oils, such as olive, canola, or sunflower oil.  Avoid cooking with butter, cream, or high-fat meats. Meal planning  Eat meals and snacks regularly, preferably at the same times every day. Avoid going long periods of time without eating.  Eat foods high in fiber, such as fresh fruits, vegetables, beans, and whole grains. Talk to your dietitian about how many servings of carbohydrates you can eat at each meal.  Eat 4-6 ounces of lean protein each day, such as lean meat, chicken, fish, eggs, or tofu. 1 ounce is equal to 1 ounce of meat, chicken, or fish, 1 egg, or 1/4 cup  of tofu.  Eat some foods each day that contain healthy fats, such as avocado, nuts, seeds, and  fish. Lifestyle   Check your blood glucose regularly.  Exercise at least 30 minutes 5 or more days each week, or as told by your health care provider.  Take medicines as told by your health care provider.  Do not use any products that contain nicotine or tobacco, such as cigarettes and e-cigarettes. If you need help quitting, ask your health care provider.  Work with a Social worker or diabetes educator to identify strategies to manage stress and any emotional and social challenges. What are some questions to ask my health care provider?  Do I need to meet with a diabetes educator?  Do I need to meet with a dietitian?  What number can I call if I have questions?  When are the best times to check my blood glucose? Where to find more information:  American Diabetes Association: diabetes.org/food-and-fitness/food  Academy of Nutrition and Dietetics: PokerClues.dk  [employer] of Diabetes and Digestive and Kidney Diseases (NIH): ContactWire.be Summary  A healthy meal plan will help you control your blood glucose and maintain a healthy lifestyle.  Working with a diet and nutrition specialist (dietitian) can help you make a meal plan that is best for you.  Keep in mind that carbohydrates and alcohol have immediate effects on your blood glucose levels. It is important to count carbohydrates and to use alcohol carefully. This information is not intended to replace advice given to you by your health care provider. Make sure you discuss any questions you have with your health care provider. Document Released: 03/22/2005 Document Revised: 07/30/2016 Document Reviewed: 07/30/2016 Elsevier Interactive Patient Education  2018 [employer].  Hypertension Hypertension, commonly called high blood pressure, is when the force of blood pumping through the arteries is too  strong. The arteries are the blood vessels that carry blood from the heart throughout the body. Hypertension forces the heart to work harder to pump blood and may cause arteries to become narrow or stiff. Having untreated or uncontrolled hypertension can cause heart attacks, strokes, kidney disease, and other problems. A blood pressure reading consists of a higher number over a lower number. Ideally, your blood pressure should be below 120/80. The first ("top") number is called the systolic pressure. It is a measure of the pressure in your arteries as your heart beats. The second ("bottom") number is called the diastolic pressure. It is a measure of the pressure in your arteries as the heart relaxes. What are the causes? The cause of this condition is not known. What increases the risk? Some risk factors for high blood pressure are under your control. Others are not. Factors you can change  Smoking.  Having type 2 diabetes mellitus, high cholesterol, or both.  Not getting enough exercise or physical activity.  Being overweight.  Having too much fat, sugar, calories, or salt (sodium) in your diet.  Drinking too much alcohol. Factors that are difficult or impossible to change  Having chronic kidney disease.  Having a family history of high blood pressure.  Age. Risk increases with age.  Race. You may be at higher risk if you are African-American.  Gender. Men are at higher risk than women before age 63. After age 27, women are at higher risk than men.  Having obstructive sleep apnea.  Stress. What are the signs or symptoms? Extremely high blood pressure (hypertensive crisis) may cause:  Headache.  Anxiety.  Shortness of breath.  Nosebleed.  Nausea and vomiting.  Severe chest pain.  Jerky movements you cannot control (seizures).  How is this diagnosed? This condition is diagnosed by measuring your blood pressure while you are seated, with your arm resting on a  surface. The cuff of the blood pressure monitor will be placed directly against the skin of your upper arm at the level of your heart. It should be measured at least twice using the same arm. Certain conditions can cause a difference in blood pressure between your right and left arms. Certain factors can cause blood pressure readings to be lower or higher than normal (elevated) for a short period of time:  When your blood pressure is higher when you are in a health care provider's office than when you are at home, this is called white coat hypertension. Most people with this condition do not need medicines.  When your blood pressure is higher at home than when you are in a health care provider's office, this is called masked hypertension. Most people with this condition may need medicines to control blood pressure.  If you have a high blood pressure reading during one visit or you have normal blood pressure with other risk factors:  You may be asked to return on a different day to have your blood pressure checked again.  You may be asked to monitor your blood pressure at home for 1 week or longer.  If you are diagnosed with hypertension, you may have other blood or imaging tests to help your health care provider understand your overall risk for other conditions. How is this treated? This condition is treated by making healthy lifestyle changes, such as eating healthy foods, exercising more, and reducing your alcohol intake. Your health care provider may prescribe medicine if lifestyle changes are not enough to get your blood pressure under control, and if:  Your systolic blood pressure is above 130.  Your diastolic blood pressure is above 80.  Your personal target blood pressure may vary depending on your medical conditions, your age, and other factors. Follow these instructions at home: Eating and drinking  Eat a diet that is high in fiber and potassium, and low in sodium, added sugar, and  fat. An example eating plan is called the DASH (Dietary Approaches to Stop Hypertension) diet. To eat this way: ? Eat plenty of fresh fruits and vegetables. Try to fill half of your plate at each meal with fruits and vegetables. ? Eat whole grains, such as whole wheat pasta, brown rice, or whole grain bread. Fill about one quarter of your plate with whole grains. ? Eat or drink low-fat dairy products, such as skim milk or low-fat yogurt. ? Avoid fatty cuts of meat, processed or cured meats, and poultry with skin. Fill about one quarter of your plate with lean proteins, such as fish, chicken without skin, beans, eggs, and tofu. ? Avoid premade and processed foods. These tend to be higher in sodium, added sugar, and fat.  Reduce your daily sodium intake. Most people with hypertension should eat less than 1,500 mg of sodium a day.  Limit alcohol intake to no more than 1 drink a day for nonpregnant women and 2 drinks a day for men. One drink equals 12 oz of beer, 5 oz of wine, or 1 oz of hard liquor. Lifestyle  Work with your health care provider to maintain a healthy body weight or to lose weight. Ask what an ideal weight is for you.  Get at least 30  minutes of exercise that causes your heart to beat faster (aerobic exercise) most days of the week. Activities may include walking, swimming, or biking.  Include exercise to strengthen your muscles (resistance exercise), such as pilates or lifting weights, as part of your weekly exercise routine. Try to do these types of exercises for 30 minutes at least 3 days a week.  Do not use any products that contain nicotine or tobacco, such as cigarettes and e-cigarettes. If you need help quitting, ask your health care provider.  Monitor your blood pressure at home as told by your health care provider.  Keep all follow-up visits as told by your health care provider. This is important. Medicines  Take over-the-counter and prescription medicines only as told  by your health care provider. Follow directions carefully. Blood pressure medicines must be taken as prescribed.  Do not skip doses of blood pressure medicine. Doing this puts you at risk for problems and can make the medicine less effective.  Ask your health care provider about side effects or reactions to medicines that you should watch for. Contact a health care provider if:  You think you are having a reaction to a medicine you are taking.  You have headaches that keep coming back (recurring).  You feel dizzy.  You have swelling in your ankles.  You have trouble with your vision. Get help right away if:  You develop a severe headache or confusion.  You have unusual weakness or numbness.  You feel faint.  You have severe pain in your chest or abdomen.  You vomit repeatedly.  You have trouble breathing. Summary  Hypertension is when the force of blood pumping through your arteries is too strong. If this condition is not controlled, it may put you at risk for serious complications.  Your personal target blood pressure may vary depending on your medical conditions, your age, and other factors. For most people, a normal blood pressure is less than 120/80.  Hypertension is treated with lifestyle changes, medicines, or a combination of both. Lifestyle changes include weight loss, eating a healthy, low-sodium diet, exercising more, and limiting alcohol. This information is not intended to replace advice given to you by your health care provider. Make sure you discuss any questions you have with your health care provider. Document Released: 06/25/2005 Document Revised: 05/23/2016 Document Reviewed: 05/23/2016 Elsevier Interactive Patient Education  2018 Elsevier Inc.      Agustina Caroli, MD [facility]

## 2017-11-29 NOTE — Assessment & Plan Note (Signed)
Blood pressure persistently elevated.  Asymptomatic.  Will increase lisinopril to 10 mg a day.  Will increase carvedilol to 12.5 mg twice a day.  Continue amlodipine 10 mg a day.  Continue Diamox thousand milligrams a day.  Follow-up in 1 month.

## 2017-11-29 NOTE — Assessment & Plan Note (Signed)
Hemoglobin A1c elevated.  Higher than before.  Patient was not taking metformin because of interaction with Diamox.  Will start glipizide 5 mg twice a day.  Follow-up in 1 month.  Continue diabetic diet.  Glucometer prescribed and advised to check his sugars twice a day.

## 2017-11-30 ENCOUNTER — Other Ambulatory Visit: Payer: Self-pay | Admitting: Emergency Medicine

## 2017-11-30 DIAGNOSIS — I10 Essential (primary) hypertension: Secondary | ICD-10-CM

## 2017-11-30 LAB — BASIC METABOLIC PANEL
BUN/Creatinine Ratio: 13 (ref 9–20)
BUN: 17 mg/dL (ref 6–20)
CO2: 16 mmol/L — AB (ref 20–29)
CREATININE: 1.31 mg/dL — AB (ref 0.76–1.27)
Calcium: 10.1 mg/dL (ref 8.7–10.2)
Chloride: 102 mmol/L (ref 96–106)
GFR, EST AFRICAN AMERICAN: 79 mL/min/{1.73_m2} (ref >59)
GFR, EST NON AFRICAN AMERICAN: 69 mL/min/{1.73_m2} (ref >59)
Glucose: 466 mg/dL — ABNORMAL HIGH (ref 65–99)
POTASSIUM: 4.1 mmol/L (ref 3.5–5.2)
SODIUM: 132 mmol/L — AB (ref 134–144)

## 2017-12-03 ENCOUNTER — Encounter: Payer: Self-pay | Admitting: *Deleted

## 2017-12-05 ENCOUNTER — Telehealth: Payer: Self-pay | Admitting: Emergency Medicine

## 2017-12-05 NOTE — Telephone Encounter (Signed)
Copied from CRM [id number]. Topic: Inquiry >> Dec 05, 2017  9:13 AM Crist Infante wrote: Reason for CRM: Deanna calling to request a handicap form signed by the dr for the pt.  She states when they were in the office the other day, there were none.please call when ready for pick up  Application form completed.  Placed in Dr. Latrelle Dodrill box at desk for MD signature and completion.   Spoke to pt - advised form will be signed and we will call when he can pick up.  Addressed prior message RE: why he was referred to [facility]. Per chart note, to evaluate HTN, elevated Creatinine and low bicarb.   Pt states he will see them tomorrow.

## 2017-12-05 NOTE — Telephone Encounter (Signed)
Copied from CRM [id number]. Topic: Referral - Question >> Dec 05, 2017  8:08 AM Oneal Grout wrote: Reason for CRM: Would like to know why he was referred to [facility]. Please advise Requesting call back asap   Please advise

## 2017-12-06 ENCOUNTER — Telehealth: Payer: Self-pay | Admitting: Emergency Medicine

## 2017-12-06 ENCOUNTER — Telehealth: Payer: Self-pay | Admitting: *Deleted

## 2017-12-06 NOTE — Telephone Encounter (Signed)
Done. Thanks.

## 2017-12-06 NOTE — Telephone Encounter (Signed)
12/06/2017 - DR. MIGUEL GAVE ME THIS PATIENT'S APPLICATION FOR DISABILITY PARKING PLACARD FORM TO CALL AND LET HIM KNOW IT IS COMPLETED AND READY FOR HIM TO PICK UP AT [address]. I LEFT HIM A MESSAGE TODAY ON HIS VOICE MAIL AT 2:26pm. I HAVE MADE A COPY FOR [place] TO SCAN. MBC

## 2017-12-06 NOTE — Telephone Encounter (Addendum)
Faxed prescription for Metformin 500 mg to discontinue to patient's pharmacy, per Dr Alvy Bimler. Confirmation page received at 9:42 am.

## 2017-12-12 ENCOUNTER — Other Ambulatory Visit: Payer: Self-pay | Admitting: Nephrology

## 2017-12-12 DIAGNOSIS — N183 Chronic kidney disease, stage 3 unspecified: Secondary | ICD-10-CM

## 2017-12-12 DIAGNOSIS — I159 Secondary hypertension, unspecified: Secondary | ICD-10-CM

## 2017-12-12 DIAGNOSIS — E872 Acidosis, unspecified: Secondary | ICD-10-CM

## 2017-12-12 DIAGNOSIS — E118 Type 2 diabetes mellitus with unspecified complications: Secondary | ICD-10-CM

## 2017-12-15 ENCOUNTER — Encounter (HOSPITAL_COMMUNITY): Payer: Self-pay | Admitting: Emergency Medicine

## 2017-12-15 ENCOUNTER — Ambulatory Visit (HOSPITAL_COMMUNITY)
Admission: EM | Admit: 2017-12-15 | Discharge: 2017-12-15 | Disposition: A | Discharge status: Home / Self Care | Payer: Self-pay | Attending: Family Medicine | Admitting: Family Medicine

## 2017-12-15 DIAGNOSIS — R112 Nausea with vomiting, unspecified: Secondary | ICD-10-CM

## 2017-12-15 LAB — GLUCOSE, CAPILLARY: GLUCOSE-CAPILLARY: 328 mg/dL — AB (ref 65–99)

## 2017-12-15 MED ORDER — ONDANSETRON HCL 4 MG PO TABS: 4.0000 mg | ORAL_TABLET | Freq: Three times a day (TID) | ORAL | 0 refills | Status: DC | PRN

## 2017-12-15 MED ORDER — CLONIDINE HCL 0.1 MG PO TABS
0.2000 mg | ORAL_TABLET | Freq: Once | ORAL | Status: AC
  Administered 2017-12-15: 0.2 mg via ORAL

## 2017-12-15 MED ORDER — ACETAMINOPHEN 325 MG PO TABS
650.0000 mg | ORAL_TABLET | Freq: Once | ORAL | Status: AC
  Administered 2017-12-15: 650 mg via ORAL

## 2017-12-15 MED ORDER — INSULIN ASPART 100 UNIT/ML ~~LOC~~ SOLN
10.0000 [IU] | Freq: Once | SUBCUTANEOUS | Status: AC
  Administered 2017-12-15: 10 [IU] via SUBCUTANEOUS

## 2017-12-15 MED ORDER — ONDANSETRON HCL 4 MG/2ML IJ SOLN
INTRAMUSCULAR | Status: AC
  Filled 2017-12-15: qty 2

## 2017-12-15 MED ORDER — ONDANSETRON HCL 4 MG/2ML IJ SOLN
4.0000 mg | Freq: Once | INTRAMUSCULAR | Status: AC
  Administered 2017-12-15: 4 mg via INTRAMUSCULAR

## 2017-12-15 MED ORDER — ACETAMINOPHEN 325 MG PO TABS
ORAL_TABLET | ORAL | Status: AC
  Filled 2017-12-15: qty 2

## 2017-12-15 MED ORDER — INSULIN ASPART 100 UNIT/ML ~~LOC~~ SOLN
SUBCUTANEOUS | Status: AC
  Filled 2017-12-15: qty 1

## 2017-12-15 MED ORDER — CLONIDINE HCL 0.1 MG PO TABS
ORAL_TABLET | ORAL | Status: AC
  Filled 2017-12-15: qty 2

## 2017-12-15 NOTE — ED Provider Notes (Signed)
[place]    CSN: [id number] Arrival date & time: 12/15/17  1644     History   Chief Complaint Chief Complaint  Patient presents with  . Emesis    HPI William Zuniga is a 37 y.o. male.   Patient has complicated  history of increased intracranial pressure.  He has been followed at [facility].  He is on several blood pressure medicines but pressure has been poorly controlled; medications are suboptimal he saw a nephrologist recently who changed some of his blood pressure medicines but he has not yet picked up those prescriptions.  He is here today secondary to nausea vomiting and hiccups.  He endorses headache as well as chest pain. HPI  Past Medical History:  Diagnosis Date  . Diabetes mellitus without complication ([place])   . Hypertension     Patient Active Problem List   Diagnosis Date Noted  . Type 2 diabetes mellitus with hyperglycemia, without long-term current use of insulin ([place]) 11/29/2017  . Essential hypertension, malignant 08/30/2017  . Idiopathic intracranial hypertension 08/30/2017  . History of hypokalemia 08/30/2017  . Hypertensive crisis 07/26/2017  . 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 PRES (posterior reversible encephalopathy syndrome) 07/26/2017  . 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 No pertinent surgical history.     Home Medications    Prior to Admission medications   Medication Sig Start Date End Date Taking? Authorizing Provider  acetaminophen (TYLENOL) 500 MG tablet Take 2 tablets (1,000 mg total) by mouth every 6 (six) hours as needed for mild pain, moderate pain or fever. 07/30/17   Rice, Resa Miner, MD  acetaZOLAMIDE (DIAMOX) 250 MG tablet Take 2 tablets (500 mg total) by mouth 2 (two) times daily. 09/09/17 12/08/17  Horald Pollen, MD  amLODipine (NORVASC) 10 MG tablet Take 1 tablet (10 mg total) by mouth daily. 08/30/17 11/28/17  Horald Pollen, MD  blood glucose meter kit and  supplies KIT Dispense based on patient and insurance preference. Use up to four times daily as directed. (FOR ICD-9 250.00, 250.01). 11/29/17   Horald Pollen, MD  carvedilol (COREG) 12.5 MG tablet Take 1 tablet (12.5 mg total) by mouth 2 (two) times daily with a meal. 11/29/17   Sagardia, Ines Bloomer, MD  glipiZIDE (GLUCOTROL) 5 MG tablet Take 1 tablet (5 mg total) by mouth 2 (two) times daily before a meal. 11/29/17 02/27/18  Sagardia, Ines Bloomer, MD  hydrALAZINE (APRESOLINE) 50 MG tablet Take 1 tablet (50 mg total) by mouth every 8 (eight) hours. Patient not taking: Reported on 11/29/2017 07/31/17   Collier Salina, MD  insulin aspart (NOVOLOG) 100 UNIT/ML injection Inject 0-9 Units into the skin 3 (three) times daily with meals. Patient not taking: Reported on 08/30/2017 07/31/17   Collier Salina, MD  insulin aspart (NOVOLOG) 100 UNIT/ML injection Inject 0-5 Units into the skin at bedtime. Patient not taking: Reported on 08/30/2017 07/31/17   Collier Salina, MD  labetalol (NORMODYNE,TRANDATE) 5 MG/ML injection Inject 2 mLs (10 mg total) into the vein every 6 (six) hours as needed (SBP > 160). Patient not taking: Reported on 08/30/2017 07/30/17   Collier Salina, MD  lisinopril (PRINIVIL,ZESTRIL) 10 MG tablet Take 1 tablet (10 mg total) by mouth daily. 11/29/17   Horald Pollen, MD  ondansetron (ZOFRAN) 4 MG tablet Take 1 tablet (4 mg total) by mouth every 8 (eight) hours as needed for nausea or vomiting. Patient not taking: Reported on 08/30/2017  07/30/17   Rice, Resa Miner, MD  potassium chloride (K-DUR,KLOR-CON) 10 MEQ tablet Take 1 tablet (10 mEq total) by mouth 2 (two) times daily. 08/30/17 11/28/17  Horald Pollen, MD    Family History No family history on file.  Social History Social History   Tobacco Use  . Smoking status: Never Smoker  . Smokeless tobacco: Never Used  Substance Use Topics  . Alcohol use: No    Frequency: Never  . Drug use: No      Allergies   Shellfish allergy   Review of Systems Review of Systems  Constitutional: Negative.   HENT: Negative.   Respiratory: Negative.   Cardiovascular: Positive for chest pain.  Gastrointestinal: Positive for vomiting.  Musculoskeletal: Negative.      Physical Exam Triage Vital Signs ED Triage Vitals [12/15/17 1701]  Enc Vitals Group     BP (!) 184/119     Pulse Rate 81     Resp 18     Temp 98.5 F (36.9 C)     Temp src      SpO2 99 %     Weight      Height      Head Circumference      Peak Flow      Pain Score 0     Pain Loc      Pain Edu?      Excl. in [place]?    No data found.  Updated Vital Signs BP (!) 184/119   Pulse 81   Temp 98.5 F (36.9 C)   Resp 18   SpO2 99%   Visual Acuity Right Eye Distance:   Left Eye Distance:   Bilateral Distance:    Right Eye Near:   Left Eye Near:    Bilateral Near:     Physical Exam  Constitutional: He is oriented to person, place, and time. He appears well-developed and well-nourished.  HENT:  Head: Normocephalic.  Eyes:  Patient with poor vision secondary to underlying problems  Neck: Normal range of motion.  Cardiovascular: Normal rate and regular rhythm.  Pulmonary/Chest: Effort normal and breath sounds normal.  Abdominal: Soft.  Neurological: He is alert and oriented to person, place, and time.     UC Treatments / Results  Labs (all labs ordered are listed, but only abnormal results are displayed) Labs Reviewed - No data to display  EKG None  Radiology No results found.  Procedures Procedures (including critical care time)  Medications Ordered in UC Medications - No data to display  Initial Impression / Assessment and Plan / UC Course  I have reviewed the triage vital signs and the nursing notes.  Pertinent labs & imaging results that were available during my care of the patient were reviewed by me and considered in my medical decision making (see chart for details).      Nausea and vomiting of uncertain etiology.  With history of increased intracranial pressure cannot rule that out as cause.  Blood pressure also markedly elevated and patient responded nicely to 0.2 mg of clonidine p.o.  He was also given 5 mg of aspartate insulin for blood sugar of 328.  He was instructed to follow-up with his PCP and get medicines prescribed by nephrologist for control blood pressure Final Clinical Impressions(s) / UC Diagnoses   Final diagnoses:  None   Discharge Instructions   None    ED Prescriptions    None     Controlled Substance Prescriptions [place] Controlled Substance Registry consulted?  No   Wardell Honour, MD 12/15/17 [phone]

## 2017-12-15 NOTE — ED Triage Notes (Signed)
Pt c/o vomiting and hiccups since Friday.

## 2017-12-17 ENCOUNTER — Emergency Department (HOSPITAL_COMMUNITY): Payer: Medicaid Other

## 2017-12-17 ENCOUNTER — Other Ambulatory Visit: Payer: Self-pay

## 2017-12-17 ENCOUNTER — Encounter (HOSPITAL_COMMUNITY): Payer: Self-pay | Admitting: Emergency Medicine

## 2017-12-17 ENCOUNTER — Emergency Department (HOSPITAL_COMMUNITY)
Admission: EM | Admit: 2017-12-17 | Discharge: 2017-12-18 | Disposition: A | Discharge status: Home / Self Care | Payer: Medicaid Other | Attending: Emergency Medicine | Admitting: Emergency Medicine

## 2017-12-17 DIAGNOSIS — R112 Nausea with vomiting, unspecified: Secondary | ICD-10-CM | POA: Diagnosis present

## 2017-12-17 DIAGNOSIS — Z5329 Procedure and treatment not carried out because of patient's decision for other reasons: Secondary | ICD-10-CM | POA: Diagnosis not present

## 2017-12-17 DIAGNOSIS — R079 Chest pain, unspecified: Secondary | ICD-10-CM | POA: Insufficient documentation

## 2017-12-17 DIAGNOSIS — G932 Benign intracranial hypertension: Secondary | ICD-10-CM | POA: Insufficient documentation

## 2017-12-17 DIAGNOSIS — H548 Legal blindness, as defined in USA: Secondary | ICD-10-CM

## 2017-12-17 DIAGNOSIS — Z79899 Other long term (current) drug therapy: Secondary | ICD-10-CM | POA: Insufficient documentation

## 2017-12-17 DIAGNOSIS — R1110 Vomiting, unspecified: Secondary | ICD-10-CM

## 2017-12-17 DIAGNOSIS — Z9114 Patient's other noncompliance with medication regimen: Secondary | ICD-10-CM | POA: Diagnosis not present

## 2017-12-17 DIAGNOSIS — R066 Hiccough: Secondary | ICD-10-CM | POA: Insufficient documentation

## 2017-12-17 LAB — CBC WITH DIFFERENTIAL/PLATELET
Abs Immature Granulocytes: 0 10*3/uL (ref 0.0–0.1)
BASOS ABS: 0.1 10*3/uL (ref 0.0–0.1)
Basophils Relative: 1 %
Eosinophils Absolute: 0.3 10*3/uL (ref 0.0–0.7)
Eosinophils Relative: 3 %
HCT: 42.7 % (ref 39.0–52.0)
HEMOGLOBIN: 14.2 g/dL (ref 13.0–17.0)
Immature Granulocytes: 0 %
LYMPHS PCT: 29 %
Lymphs Abs: 3 10*3/uL (ref 0.7–4.0)
MCH: 29.4 pg (ref 26.0–34.0)
MCHC: 33.3 g/dL (ref 30.0–36.0)
MCV: 88.4 fL (ref 78.0–100.0)
Monocytes Absolute: 0.9 10*3/uL (ref 0.1–1.0)
Monocytes Relative: 9 %
NEUTROS ABS: 6 10*3/uL (ref 1.7–7.7)
NEUTROS PCT: 58 %
Platelets: 407 10*3/uL — ABNORMAL HIGH (ref 150–400)
RBC: 4.83 MIL/uL (ref 4.22–5.81)
RDW: 12.8 % (ref 11.5–15.5)
WBC: 10.3 10*3/uL (ref 4.0–10.5)

## 2017-12-17 LAB — COMPREHENSIVE METABOLIC PANEL
ALT: 14 U/L — ABNORMAL LOW (ref 17–63)
ANION GAP: 10 (ref 5–15)
AST: 13 U/L — ABNORMAL LOW (ref 15–41)
Albumin: 3.7 g/dL (ref 3.5–5.0)
Alkaline Phosphatase: 69 U/L (ref 38–126)
BUN: 14 mg/dL (ref 6–20)
CHLORIDE: 103 mmol/L (ref 101–111)
CO2: 22 mmol/L (ref 22–32)
Calcium: 9 mg/dL (ref 8.9–10.3)
Creatinine, Ser: 1.22 mg/dL (ref 0.61–1.24)
Glucose, Bld: 265 mg/dL — ABNORMAL HIGH (ref 65–99)
POTASSIUM: 3.3 mmol/L — AB (ref 3.5–5.1)
Sodium: 135 mmol/L (ref 135–145)
Total Bilirubin: 0.7 mg/dL (ref 0.3–1.2)
Total Protein: 7.6 g/dL (ref 6.5–8.1)

## 2017-12-17 LAB — LIPASE, BLOOD: LIPASE: 28 U/L (ref 11–51)

## 2017-12-17 LAB — I-STAT TROPONIN, ED: TROPONIN I, POC: 0.02 ng/mL (ref 0.00–0.08)

## 2017-12-17 MED ORDER — AMLODIPINE BESYLATE 5 MG PO TABS
10.0000 mg | ORAL_TABLET | Freq: Once | ORAL | Status: AC
  Administered 2017-12-17: 10 mg via ORAL
  Filled 2017-12-17: qty 2

## 2017-12-17 MED ORDER — LISINOPRIL 10 MG PO TABS
10.0000 mg | ORAL_TABLET | Freq: Once | ORAL | Status: AC
  Administered 2017-12-17: 10 mg via ORAL
  Filled 2017-12-17: qty 1

## 2017-12-17 NOTE — ED Notes (Signed)
Pt transported to CT before EKG ordered. EKG pending return of pt from CT

## 2017-12-17 NOTE — ED Provider Notes (Signed)
Patient placed in Quick Look pathway, seen and evaluated   Chief Complaint: Nausea, vomiting, hiccups.   HPI:   History of intracranial HTN.  Nausea, hiccups for the past 4 days.  When he does not have hiccups he has midline burning chest pain.  He has not been able to take his blood pressure or intracranial pressure medication for the past 4 days. Visitor reports that his hiccups stop at night when he is asleep.   ROS: No fevers  Physical Exam:   Gen: No distress  Neuro: Awake and Alert  Skin: Warm    Focused Exam: Abdomen is soft, non tender, non distended.  Patient has continous hiccups.     Initiation of care has begun. The patient has been counseled on the process, plan, and necessity for staying for the completion/evaluation, and the remainder of the medical screening examination    Norman ClayHammond, Danajah Birdsell W, PA-C 12/17/17 2024    Rolan BuccoBelfi, Melanie, MD 12/17/17 2036

## 2017-12-17 NOTE — ED Notes (Signed)
Pt notified Rn that he would like to have his BP medication if possible, he hasn't been able to take it in several days due to n/v but feels like he can take it now. PA notified and gave verbal order.

## 2017-12-17 NOTE — ED Triage Notes (Signed)
Pt has not been able to keep any food down since Friday, is unable to keep blood pressure medication down.  States he tries to eat and feels something in his chest area and then throws up.  Has had hiccups.

## 2017-12-18 ENCOUNTER — Encounter: Payer: Self-pay | Admitting: Emergency Medicine

## 2017-12-18 ENCOUNTER — Other Ambulatory Visit: Payer: Self-pay

## 2017-12-18 ENCOUNTER — Ambulatory Visit (INDEPENDENT_AMBULATORY_CARE_PROVIDER_SITE_OTHER): Payer: Medicaid Other | Admitting: Emergency Medicine

## 2017-12-18 ENCOUNTER — Telehealth (HOSPITAL_COMMUNITY): Payer: Self-pay | Admitting: Emergency Medicine

## 2017-12-18 VITALS — BP 167/121 | HR 81 | Temp 98.3°F | Resp 18 | Ht 70.5 in | Wt 263.0 lb

## 2017-12-18 DIAGNOSIS — I10 Essential (primary) hypertension: Secondary | ICD-10-CM | POA: Diagnosis not present

## 2017-12-18 DIAGNOSIS — R066 Hiccough: Secondary | ICD-10-CM | POA: Diagnosis not present

## 2017-12-18 DIAGNOSIS — K2100 Gastro-esophageal reflux disease with esophagitis, without bleeding: Secondary | ICD-10-CM | POA: Insufficient documentation

## 2017-12-18 DIAGNOSIS — R112 Nausea with vomiting, unspecified: Secondary | ICD-10-CM

## 2017-12-18 MED ORDER — OMEPRAZOLE 40 MG PO CPDR: 40.0000 mg | DELAYED_RELEASE_CAPSULE | Freq: Every day | ORAL | 3 refills | Status: AC

## 2017-12-18 MED ORDER — ONDANSETRON HCL 4 MG/2ML IJ SOLN
2.0000 mg | Freq: Once | INTRAMUSCULAR | Status: AC
Start: 2017-12-18 — End: 2017-12-18
  Administered 2017-12-18: 2 mg via INTRAMUSCULAR

## 2017-12-18 MED ORDER — ONDANSETRON HCL 4 MG PO TABS: 4.0000 mg | ORAL_TABLET | Freq: Three times a day (TID) | ORAL | 0 refills | Status: AC | PRN

## 2017-12-18 MED ORDER — CLONIDINE HCL 0.1 MG PO TABS
0.2000 mg | ORAL_TABLET | Freq: Once | ORAL | Status: AC
  Administered 2017-12-18: 0.2 mg via ORAL

## 2017-12-18 NOTE — Patient Instructions (Addendum)
IF you received an x-ray today, you will receive an invoice from [facility]. Please contact [facility] at [phone] with questions or concerns regarding your invoice.   IF you received labwork today, you will receive an invoice from [place]. Please contact [facility] at [phone] with questions or concerns regarding your invoice.   Our billing staff will not be able to assist you with questions regarding bills from these companies.  You will be contacted with the lab results as soon as they are available. The fastest way to get your results is to activate your My Chart account. Instructions are located on the last page of this paperwork. If you have not heard from [place] regarding the results in 2 weeks, please contact this office.     Hiccups A hiccup is the result of a sudden shortening of the muscle below your lungs (diaphragm). This movement of your diaphragm causes a sudden inhalation followed by the closing of your vocal cords, which causes the hiccup sound. Most people get the hiccups. Typically, hiccups last only a short amount of time. There are three types of hiccups:  Benign. These hiccups last less than 48 hours.  Persistent. These hiccups last more than 48 hours, but less than 1 month.  Intractable. These hiccups last more than 1 month.  A hiccup is a reflex. You cannot control reflexes. Follow these instructions at home: Watch your hiccups for any changes. The following actions may help to lessen any discomfort that you are feeling:  Eat small meals.  Limit alcohol intake to no more than 1 drink per day for nonpregnant women and 2 drinks per day for men. One drink equals 12 oz of beer, 5 oz of wine, or 1 oz of hard liquor.  Limit drinking carbonated or fizzy drinks, such as soda.  Eat and chew your food slowly.  Avoid eating or drinking hot or spicy foods and drinks.  Take medicines only as directed by your health care provider.  Contact  a health care provider if:  Your hiccups last for more than 48 hours.  Your hiccups do not improve with treatment.  You cannot sleep or eat due to the hiccups.  You have unexpected weight loss due to the hiccups.  You have a fever.  You have trouble breathing or swallowing.  You develop severe pain in your abdomen.  You develop numbness, tingling, or weakness. This information is not intended to replace advice given to you by your health care provider. Make sure you discuss any questions you have with your health care provider. Document Released: 09/03/2001 Document Revised: 12/01/2015 Document Reviewed: 06/21/2014 Elsevier Interactive Patient Education  2018 [employer].  Food Choices for Gastroesophageal Reflux Disease, Adult When you have gastroesophageal reflux disease (GERD), the foods you eat and your eating habits are very important. Choosing the right foods can help ease your discomfort. What guidelines do I need to follow?  Choose fruits, vegetables, whole grains, and low-fat dairy products.  Choose low-fat meat, fish, and poultry.  Limit fats such as oils, salad dressings, butter, nuts, and avocado.  Keep a food diary. This helps you identify foods that cause symptoms.  Avoid foods that cause symptoms. These may be different for everyone.  Eat small meals often instead of 3 large meals a day.  Eat your meals slowly, in a place where you are relaxed.  Limit fried foods.  Cook foods using methods other than frying.  Avoid drinking alcohol.  Avoid drinking large  amounts of liquids with your meals.  Avoid bending over or lying down until 2-3 hours after eating. What foods are not recommended? These are some foods and drinks that may make your symptoms worse: Vegetables Tomatoes. Tomato juice. Tomato and spaghetti sauce. Chili peppers. Onion and garlic. Horseradish. Fruits Oranges, grapefruit, and lemon (fruit and juice). Meats High-fat meats, fish, and  poultry. This includes hot dogs, ribs, ham, sausage, salami, and bacon. Dairy Whole milk and chocolate milk. Sour cream. Cream. Butter. Ice cream. Cream cheese. Drinks Coffee and tea. Bubbly (carbonated) drinks or energy drinks. Condiments Hot sauce. Barbecue sauce. Sweets/Desserts Chocolate and cocoa. Donuts. Peppermint and spearmint. Fats and Oils High-fat foods. This includes [place] fries and potato chips. Other Vinegar. Strong spices. This includes black pepper, white pepper, red pepper, cayenne, curry powder, cloves, ginger, and chili powder. The items listed above may not be a complete list of foods and drinks to avoid. Contact your dietitian for more information. This information is not intended to replace advice given to you by your health care provider. Make sure you discuss any questions you have with your health care provider. Document Released: 12/25/2011 Document Revised: 12/01/2015 Document Reviewed: 04/29/2013 Elsevier Interactive Patient Education  2017 Elsevier Inc.  Hypertension Hypertension, commonly called high blood pressure, is when the force of blood pumping through the arteries is too strong. The arteries are the blood vessels that carry blood from the heart throughout the body. Hypertension forces the heart to work harder to pump blood and may cause arteries to become narrow or stiff. Having untreated or uncontrolled hypertension can cause heart attacks, strokes, kidney disease, and other problems. A blood pressure reading consists of a higher number over a lower number. Ideally, your blood pressure should be below 120/80. The first ("top") number is called the systolic pressure. It is a measure of the pressure in your arteries as your heart beats. The second ("bottom") number is called the diastolic pressure. It is a measure of the pressure in your arteries as the heart relaxes. What are the causes? The cause of this condition is not known. What increases the  risk? Some risk factors for high blood pressure are under your control. Others are not. Factors you can change  Smoking.  Having type 2 diabetes mellitus, high cholesterol, or both.  Not getting enough exercise or physical activity.  Being overweight.  Having too much fat, sugar, calories, or salt (sodium) in your diet.  Drinking too much alcohol. Factors that are difficult or impossible to change  Having chronic kidney disease.  Having a family history of high blood pressure.  Age. Risk increases with age.  Race. You may be at higher risk if you are African-American.  Gender. Men are at higher risk than women before age 10. After age 46, women are at higher risk than men.  Having obstructive sleep apnea.  Stress. What are the signs or symptoms? Extremely high blood pressure (hypertensive crisis) may cause:  Headache.  Anxiety.  Shortness of breath.  Nosebleed.  Nausea and vomiting.  Severe chest pain.  Jerky movements you cannot control (seizures).  How is this diagnosed? This condition is diagnosed by measuring your blood pressure while you are seated, with your arm resting on a surface. The cuff of the blood pressure monitor will be placed directly against the skin of your upper arm at the level of your heart. It should be measured at least twice using the same arm. Certain conditions can cause a difference in blood  pressure between your right and left arms. Certain factors can cause blood pressure readings to be lower or higher than normal (elevated) for a short period of time:  When your blood pressure is higher when you are in a health care provider's office than when you are at home, this is called white coat hypertension. Most people with this condition do not need medicines.  When your blood pressure is higher at home than when you are in a health care provider's office, this is called masked hypertension. Most people with this condition may need  medicines to control blood pressure.  If you have a high blood pressure reading during one visit or you have normal blood pressure with other risk factors:  You may be asked to return on a different day to have your blood pressure checked again.  You may be asked to monitor your blood pressure at home for 1 week or longer.  If you are diagnosed with hypertension, you may have other blood or imaging tests to help your health care provider understand your overall risk for other conditions. How is this treated? This condition is treated by making healthy lifestyle changes, such as eating healthy foods, exercising more, and reducing your alcohol intake. Your health care provider may prescribe medicine if lifestyle changes are not enough to get your blood pressure under control, and if:  Your systolic blood pressure is above 130.  Your diastolic blood pressure is above 80.  Your personal target blood pressure may vary depending on your medical conditions, your age, and other factors. Follow these instructions at home: Eating and drinking  Eat a diet that is high in fiber and potassium, and low in sodium, added sugar, and fat. An example eating plan is called the DASH (Dietary Approaches to Stop Hypertension) diet. To eat this way: ? Eat plenty of fresh fruits and vegetables. Try to fill half of your plate at each meal with fruits and vegetables. ? Eat whole grains, such as whole wheat pasta, brown rice, or whole grain bread. Fill about one quarter of your plate with whole grains. ? Eat or drink low-fat dairy products, such as skim milk or low-fat yogurt. ? Avoid fatty cuts of meat, processed or cured meats, and poultry with skin. Fill about one quarter of your plate with lean proteins, such as fish, chicken without skin, beans, eggs, and tofu. ? Avoid premade and processed foods. These tend to be higher in sodium, added sugar, and fat.  Reduce your daily sodium intake. Most people with  hypertension should eat less than 1,500 mg of sodium a day.  Limit alcohol intake to no more than 1 drink a day for nonpregnant women and 2 drinks a day for men. One drink equals 12 oz of beer, 5 oz of wine, or 1 oz of hard liquor. Lifestyle  Work with your health care provider to maintain a healthy body weight or to lose weight. Ask what an ideal weight is for you.  Get at least 30 minutes of exercise that causes your heart to beat faster (aerobic exercise) most days of the week. Activities may include walking, swimming, or biking.  Include exercise to strengthen your muscles (resistance exercise), such as pilates or lifting weights, as part of your weekly exercise routine. Try to do these types of exercises for 30 minutes at least 3 days a week.  Do not use any products that contain nicotine or tobacco, such as cigarettes and e-cigarettes. If you need help quitting, ask  your health care provider.  Monitor your blood pressure at home as told by your health care provider.  Keep all follow-up visits as told by your health care provider. This is important. Medicines  Take over-the-counter and prescription medicines only as told by your health care provider. Follow directions carefully. Blood pressure medicines must be taken as prescribed.  Do not skip doses of blood pressure medicine. Doing this puts you at risk for problems and can make the medicine less effective.  Ask your health care provider about side effects or reactions to medicines that you should watch for. Contact a health care provider if:  You think you are having a reaction to a medicine you are taking.  You have headaches that keep coming back (recurring).  You feel dizzy.  You have swelling in your ankles.  You have trouble with your vision. Get help right away if:  You develop a severe headache or confusion.  You have unusual weakness or numbness.  You feel faint.  You have severe pain in your chest or  abdomen.  You vomit repeatedly.  You have trouble breathing. Summary  Hypertension is when the force of blood pumping through your arteries is too strong. If this condition is not controlled, it may put you at risk for serious complications.  Your personal target blood pressure may vary depending on your medical conditions, your age, and other factors. For most people, a normal blood pressure is less than 120/80.  Hypertension is treated with lifestyle changes, medicines, or a combination of both. Lifestyle changes include weight loss, eating a healthy, low-sodium diet, exercising more, and limiting alcohol. This information is not intended to replace advice given to you by your health care provider. Make sure you discuss any questions you have with your health care provider. Document Released: 06/25/2005 Document Revised: 05/23/2016 Document Reviewed: 05/23/2016 Elsevier Interactive Patient Education  [employer]2018 Elsevier Inc.

## 2017-12-18 NOTE — Progress Notes (Signed)
William Zuniga 37 y.o.   Chief Complaint  Patient presents with  . Emesis    Nausea, Vomiting, hiccups since 5/7. With lethargy    HISTORY OF PRESENT ILLNESS: This is a 37 y.o. male complaining of nausea, vomiting, and persistent hiccups that started last Friday.  He had burritos for dinner on Thursday evening.  Has been unable to keep medications down.  Went to the urgent care center on Sunday and was treated and released.  Was given oral clonidine on a shot of insulin.  Went to the emergency room the following day.  Had blood work done with a glucose of 265 and potassium 3.3.  Otherwise unremarkable.  CT of the brain was unremarkable.  Given a shot of Zofran and prescribed medication the pharmacy never filled it.  Has been having some burning epigastric lower sternal pain.  Troponin was negative.  EKG was unremarkable.  Lipase was within normal limits.  Chest x-ray was normal.  Still symptomatic. Saw nephrologist last week who increased lisinopril to 20 mg a day and added a diuretic but he has not started it yet.  HPI   Prior to Admission medications   Medication Sig Start Date End Date Taking? Authorizing Provider  acetaZOLAMIDE (DIAMOX) 500 MG capsule Take 500 mg by mouth 2 (two) times daily.   Yes [provider]  carvedilol (COREG) 12.5 MG tablet Take 1 tablet (12.5 mg total) by mouth 2 (two) times daily with a meal. 11/29/17  Yes Mahlik Lenn, Ines Bloomer, MD  glipiZIDE (GLUCOTROL) 5 MG tablet Take 1 tablet (5 mg total) by mouth 2 (two) times daily before a meal. 11/29/17 02/27/18 Yes Amaurie Schreckengost, Ines Bloomer, MD  lisinopril (PRINIVIL,ZESTRIL) 10 MG tablet Take 1 tablet (10 mg total) by mouth daily. 11/29/17  Yes Leea Rambeau, Ines Bloomer, MD  acetaminophen (TYLENOL) 500 MG tablet Take 2 tablets (1,000 mg total) by mouth every 6 (six) hours as needed for mild pain, moderate pain or fever. 07/30/17   Rice, Resa Miner, MD  acetaZOLAMIDE (DIAMOX) 250 MG tablet Take 2 tablets (500 mg  total) by mouth 2 (two) times daily. 09/09/17 12/08/17  Horald Pollen, MD  amLODipine (NORVASC) 10 MG tablet Take 1 tablet (10 mg total) by mouth daily. 08/30/17 11/28/17  Horald Pollen, MD  blood glucose meter kit and supplies KIT Dispense based on patient and insurance preference. Use up to four times daily as directed. (FOR ICD-9 250.00, 250.01). 11/29/17   Horald Pollen, MD  hydrALAZINE (APRESOLINE) 50 MG tablet Take 1 tablet (50 mg total) by mouth every 8 (eight) hours. Patient not taking: Reported on 11/29/2017 07/31/17   Collier Salina, MD  insulin aspart (NOVOLOG) 100 UNIT/ML injection Inject 0-9 Units into the skin 3 (three) times daily with meals. Patient not taking: Reported on 08/30/2017 07/31/17   Collier Salina, MD  insulin aspart (NOVOLOG) 100 UNIT/ML injection Inject 0-5 Units into the skin at bedtime. Patient not taking: Reported on 08/30/2017 07/31/17   Collier Salina, MD  labetalol (NORMODYNE,TRANDATE) 5 MG/ML injection Inject 2 mLs (10 mg total) into the vein every 6 (six) hours as needed (SBP > 160). Patient not taking: Reported on 08/30/2017 07/30/17   Collier Salina, MD  ondansetron (ZOFRAN) 4 MG tablet Take 1 tablet (4 mg total) by mouth every 8 (eight) hours as needed for nausea or vomiting. Patient not taking: Reported on 12/18/2017 12/15/17   Wardell Honour, MD  potassium chloride (K-DUR,KLOR-CON) 10 MEQ tablet Take 1  tablet (10 mEq total) by mouth 2 (two) times daily. 08/30/17 11/28/17  Horald Pollen, MD    Allergies  Allergen Reactions  . Shellfish Allergy     Patient Active Problem List   Diagnosis Date Noted  . Legally blind 12/17/2017  . Type 2 diabetes mellitus with hyperglycemia, without long-term current use of insulin ([place]) 11/29/2017  . Essential hypertension, malignant 08/30/2017  . Idiopathic intracranial hypertension 08/30/2017  . History of hypokalemia 08/30/2017  . Hypertensive crisis 07/26/2017  . 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 PRES (posterior reversible encephalopathy syndrome) 07/26/2017  . 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 Diabetes mellitus without complication ([place])   . Hypertension   . IIH (idiopathic intracranial hypertension)     Past Surgical History:  Procedure Laterality Date  . EYE SURGERY      Social History   Socioeconomic History  . Marital status: Married    Spouse name: Not on file  . Number of children: 7  . Years of education: Not on file  . Highest education level: Not on file  Occupational History  . Not on file  Social Needs  . Financial resource strain: Not on file  . Food insecurity:    Worry: Not on file    Inability: Not on file  . Transportation needs:    Medical: Not on file    Non-medical: Not on file  Tobacco Use  . Smoking status: Never Smoker  . Smokeless tobacco: Never Used  Substance and Sexual Activity  . Alcohol use: No    Frequency: Never  . Drug use: Yes    Types: Marijuana  . Sexual activity: Yes  Lifestyle  . Physical activity:    Days per week: Not on file    Minutes per session: Not on file  . Stress: Not on file  Relationships  . Social connections:    Talks on phone: Not on file    Gets together: Not on file    Attends religious service: Not on file    Active member of club or organization: Not on file    Attends meetings of clubs or organizations: Not on file    Relationship status: Not on file  . Intimate partner violence:    Fear of current or ex partner: Not on file    Emotionally abused: Not on file    Physically abused: Not on file    Forced sexual activity: Not on file  Other Topics Concern  . Not on file  Social History Narrative  . Not on file    History reviewed. No pertinent family history.   Review of Systems  Constitutional: Negative.  Negative for chills and fever.  HENT: Negative.  Negative for sore throat.   Respiratory:  Negative.  Negative for cough, shortness of breath and wheezing.   Cardiovascular: Negative for palpitations.  Gastrointestinal: Positive for abdominal pain, heartburn, nausea and vomiting. Negative for blood in stool, diarrhea and melena.  Genitourinary: Negative.  Negative for hematuria.  Musculoskeletal: Negative.  Negative for back pain and neck pain.  Skin: Negative.  Negative for rash.  Neurological: Negative for dizziness, sensory change, focal weakness, seizures, loss of consciousness and headaches.  Endo/Heme/Allergies: Negative.   All other systems reviewed and are negative.   Vitals:   12/18/17 1729  BP: (!) 167/121  Pulse: 81  Resp: 18  Temp: 98.3 F (36.8 C)  SpO2: 94%  Physical Exam  Constitutional: He is oriented to person, place, and time. He appears well-developed and well-nourished.  HENT:  Head: Normocephalic and atraumatic.  Neck: Normal range of motion. Neck supple.  Cardiovascular: Normal rate, regular rhythm and normal heart sounds.  Pulmonary/Chest: Effort normal and breath sounds normal.  Abdominal: Soft. Bowel sounds are normal. He exhibits no distension. There is no tenderness.  Musculoskeletal: Normal range of motion. He exhibits no edema.  Neurological: He is alert and oriented to person, place, and time. No sensory deficit. He exhibits normal muscle tone. Coordination normal.  Skin: Skin is warm and dry. Capillary refill takes less than 2 seconds.  Psychiatric: He has a normal mood and affect. His behavior is normal.  Vitals reviewed.  Pertinent labs & imaging results that were available during my care of the patient were reviewed by me and considered in my medical decision making (see chart for details).   ASSESSMENT & PLAN: [place] was seen today for emesis.  Diagnoses and all orders for this visit:  Intractable vomiting with nausea, unspecified vomiting type -     ondansetron (ZOFRAN) injection 2 mg -     ondansetron (ZOFRAN) 4 MG tablet;  Take 1 tablet (4 mg total) by mouth every 8 (eight) hours as needed for nausea or vomiting.  Uncontrolled hypertension -     cloNIDine (CATAPRES) tablet 0.2 mg  GERD with esophagitis -     omeprazole (PRILOSEC) 40 MG capsule; Take 1 capsule (40 mg total) by mouth daily.  Intractable hiccups   Follow up here in 2-4 weeks.  Patient Instructions       IF you received an x-ray today, you will receive an invoice from [facility]. Please contact [facility] at [phone] with questions or concerns regarding your invoice.   IF you received labwork today, you will receive an invoice from [facility]. Please contact [facility] at [phone] with questions or concerns regarding your invoice.   Our billing staff will not be able to assist you with questions regarding bills from these companies.  You will be contacted with the lab results as soon as they are available. The fastest way to get your results is to activate your My Chart account. Instructions are located on the last page of this paperwork. If you have not heard from [place] regarding the results in 2 weeks, please contact this office.     Hiccups A hiccup is the result of a sudden shortening of the muscle below your lungs (diaphragm). This movement of your diaphragm causes a sudden inhalation followed by the closing of your vocal cords, which causes the hiccup sound. Most people get the hiccups. Typically, hiccups last only a short amount of time. There are three types of hiccups:  Benign. These hiccups last less than 48 hours.  Persistent. These hiccups last more than 48 hours, but less than 1 month.  Intractable. These hiccups last more than 1 month.  A hiccup is a reflex. You cannot control reflexes. Follow these instructions at home: Watch your hiccups for any changes. The following actions may help to lessen any discomfort that you are feeling:  Eat small meals.  Limit alcohol intake to no more than 1  drink per day for nonpregnant women and 2 drinks per day for men. One drink equals 12 oz of beer, 5 oz of wine, or 1 oz of hard liquor.  Limit drinking carbonated or fizzy drinks, such as soda.  Eat and chew your food slowly.  Avoid eating  or drinking hot or spicy foods and drinks.  Take medicines only as directed by your health care provider.  Contact a health care provider if:  Your hiccups last for more than 48 hours.  Your hiccups do not improve with treatment.  You cannot sleep or eat due to the hiccups.  You have unexpected weight loss due to the hiccups.  You have a fever.  You have trouble breathing or swallowing.  You develop severe pain in your abdomen.  You develop numbness, tingling, or weakness. This information is not intended to replace advice given to you by your health care provider. Make sure you discuss any questions you have with your health care provider. Document Released: 09/03/2001 Document Revised: 12/01/2015 Document Reviewed: 06/21/2014 Elsevier Interactive Patient Education  2018 [place] for Gastroesophageal Reflux Disease, Adult When you have gastroesophageal reflux disease (GERD), the foods you eat and your eating habits are very important. Choosing the right foods can help ease your discomfort. What guidelines do I need to follow?  Choose fruits, vegetables, whole grains, and low-fat dairy products.  Choose low-fat meat, fish, and poultry.  Limit fats such as oils, salad dressings, butter, nuts, and avocado.  Keep a food diary. This helps you identify foods that cause symptoms.  Avoid foods that cause symptoms. These may be different for everyone.  Eat small meals often instead of 3 large meals a day.  Eat your meals slowly, in a place where you are relaxed.  Limit fried foods.  Cook foods using methods other than frying.  Avoid drinking alcohol.  Avoid drinking large amounts of liquids with your  meals.  Avoid bending over or lying down until 2-3 hours after eating. What foods are not recommended? These are some foods and drinks that may make your symptoms worse: Vegetables Tomatoes. Tomato juice. Tomato and spaghetti sauce. Chili peppers. Onion and garlic. Horseradish. Fruits Oranges, grapefruit, and lemon (fruit and juice). Meats High-fat meats, fish, and poultry. This includes hot dogs, ribs, ham, sausage, salami, and bacon. Dairy Whole milk and chocolate milk. Sour cream. Cream. Butter. Ice cream. Cream cheese. Drinks Coffee and tea. Bubbly (carbonated) drinks or energy drinks. Condiments Hot sauce. Barbecue sauce. Sweets/Desserts Chocolate and cocoa. Donuts. Peppermint and spearmint. Fats and Oils High-fat foods. This includes [place] fries and potato chips. Other Vinegar. Strong spices. This includes black pepper, white pepper, red pepper, cayenne, curry powder, cloves, ginger, and chili powder. The items listed above may not be a complete list of foods and drinks to avoid. Contact your dietitian for more information. This information is not intended to replace advice given to you by your health care provider. Make sure you discuss any questions you have with your health care provider. Document Released: 12/25/2011 Document Revised: 12/01/2015 Document Reviewed: 04/29/2013 Elsevier Interactive Patient Education  2017 Elsevier Inc.  Hypertension Hypertension, commonly called high blood pressure, is when the force of blood pumping through the arteries is too strong. The arteries are the blood vessels that carry blood from the heart throughout the body. Hypertension forces the heart to work harder to pump blood and may cause arteries to become narrow or stiff. Having untreated or uncontrolled hypertension can cause heart attacks, strokes, kidney disease, and other problems. A blood pressure reading consists of a higher number over a lower number. Ideally, your blood pressure  should be below 120/80. The first ("top") number is called the systolic pressure. It is a measure of the pressure in your arteries as your  heart beats. The second ("bottom") number is called the diastolic pressure. It is a measure of the pressure in your arteries as the heart relaxes. What are the causes? The cause of this condition is not known. What increases the risk? Some risk factors for high blood pressure are under your control. Others are not. Factors you can change  Smoking.  Having type 2 diabetes mellitus, high cholesterol, or both.  Not getting enough exercise or physical activity.  Being overweight.  Having too much fat, sugar, calories, or salt (sodium) in your diet.  Drinking too much alcohol. Factors that are difficult or impossible to change  Having chronic kidney disease.  Having a family history of high blood pressure.  Age. Risk increases with age.  Race. You may be at higher risk if you are African-American.  Gender. Men are at higher risk than women before age 27. After age 19, women are at higher risk than men.  Having obstructive sleep apnea.  Stress. What are the signs or symptoms? Extremely high blood pressure (hypertensive crisis) may cause:  Headache.  Anxiety.  Shortness of breath.  Nosebleed.  Nausea and vomiting.  Severe chest pain.  Jerky movements you cannot control (seizures).  How is this diagnosed? This condition is diagnosed by measuring your blood pressure while you are seated, with your arm resting on a surface. The cuff of the blood pressure monitor will be placed directly against the skin of your upper arm at the level of your heart. It should be measured at least twice using the same arm. Certain conditions can cause a difference in blood pressure between your right and left arms. Certain factors can cause blood pressure readings to be lower or higher than normal (elevated) for a short period of time:  When your blood  pressure is higher when you are in a health care provider's office than when you are at home, this is called white coat hypertension. Most people with this condition do not need medicines.  When your blood pressure is higher at home than when you are in a health care provider's office, this is called masked hypertension. Most people with this condition may need medicines to control blood pressure.  If you have a high blood pressure reading during one visit or you have normal blood pressure with other risk factors:  You may be asked to return on a different day to have your blood pressure checked again.  You may be asked to monitor your blood pressure at home for 1 week or longer.  If you are diagnosed with hypertension, you may have other blood or imaging tests to help your health care provider understand your overall risk for other conditions. How is this treated? This condition is treated by making healthy lifestyle changes, such as eating healthy foods, exercising more, and reducing your alcohol intake. Your health care provider may prescribe medicine if lifestyle changes are not enough to get your blood pressure under control, and if:  Your systolic blood pressure is above 130.  Your diastolic blood pressure is above 80.  Your personal target blood pressure may vary depending on your medical conditions, your age, and other factors. Follow these instructions at home: Eating and drinking  Eat a diet that is high in fiber and potassium, and low in sodium, added sugar, and fat. An example eating plan is called the DASH (Dietary Approaches to Stop Hypertension) diet. To eat this way: ? Eat plenty of fresh fruits and vegetables. Try to fill  half of your plate at each meal with fruits and vegetables. ? Eat whole grains, such as whole wheat pasta, brown rice, or whole grain bread. Fill about one quarter of your plate with whole grains. ? Eat or drink low-fat dairy products, such as skim milk or  low-fat yogurt. ? Avoid fatty cuts of meat, processed or cured meats, and poultry with skin. Fill about one quarter of your plate with lean proteins, such as fish, chicken without skin, beans, eggs, and tofu. ? Avoid premade and processed foods. These tend to be higher in sodium, added sugar, and fat.  Reduce your daily sodium intake. Most people with hypertension should eat less than 1,500 mg of sodium a day.  Limit alcohol intake to no more than 1 drink a day for nonpregnant women and 2 drinks a day for men. One drink equals 12 oz of beer, 5 oz of wine, or 1 oz of hard liquor. Lifestyle  Work with your health care provider to maintain a healthy body weight or to lose weight. Ask what an ideal weight is for you.  Get at least 30 minutes of exercise that causes your heart to beat faster (aerobic exercise) most days of the week. Activities may include walking, swimming, or biking.  Include exercise to strengthen your muscles (resistance exercise), such as pilates or lifting weights, as part of your weekly exercise routine. Try to do these types of exercises for 30 minutes at least 3 days a week.  Do not use any products that contain nicotine or tobacco, such as cigarettes and e-cigarettes. If you need help quitting, ask your health care provider.  Monitor your blood pressure at home as told by your health care provider.  Keep all follow-up visits as told by your health care provider. This is important. Medicines  Take over-the-counter and prescription medicines only as told by your health care provider. Follow directions carefully. Blood pressure medicines must be taken as prescribed.  Do not skip doses of blood pressure medicine. Doing this puts you at risk for problems and can make the medicine less effective.  Ask your health care provider about side effects or reactions to medicines that you should watch for. Contact a health care provider if:  You think you are having a reaction to  a medicine you are taking.  You have headaches that keep coming back (recurring).  You feel dizzy.  You have swelling in your ankles.  You have trouble with your vision. Get help right away if:  You develop a severe headache or confusion.  You have unusual weakness or numbness.  You feel faint.  You have severe pain in your chest or abdomen.  You vomit repeatedly.  You have trouble breathing. Summary  Hypertension is when the force of blood pumping through your arteries is too strong. If this condition is not controlled, it may put you at risk for serious complications.  Your personal target blood pressure may vary depending on your medical conditions, your age, and other factors. For most people, a normal blood pressure is less than 120/80.  Hypertension is treated with lifestyle changes, medicines, or a combination of both. Lifestyle changes include weight loss, eating a healthy, low-sodium diet, exercising more, and limiting alcohol. This information is not intended to replace advice given to you by your health care provider. Make sure you discuss any questions you have with your health care provider. Document Released: 06/25/2005 Document Revised: 05/23/2016 Document Reviewed: 05/23/2016 Elsevier Interactive Patient Education  2018  Elsevier Inc.      Agustina Caroli, MD [facility]

## 2017-12-18 NOTE — ED Notes (Signed)
No answer when called for vitals. 

## 2017-12-18 NOTE — Telephone Encounter (Signed)
[facility] calling to say that Dr. Hyacinth MeekerMiller is not covered under Medicaid for them to fill the Rx for Zofran.  Rx called in on 12/15/17, but pt has not tried to pick this up.  Is Dr. Hyacinth MeekerMiller covered under Medicaid?  Can we confirm his NPI number and make sure [facility] has the correct one?  Thanks,  Dayton MartesMunday, Vernal Hritz A

## 2017-12-20 ENCOUNTER — Telehealth: Payer: Self-pay | Admitting: Emergency Medicine

## 2017-12-20 NOTE — Telephone Encounter (Signed)
Copied from CRM [id number]. Topic: Quick Communication - See Telephone Encounter >> Dec 20, 2017 12:34 PM Lorrine KinMcGee, Zacharey Jensen B, NT wrote: CRM for notification. See Telephone encounter for: 12/20/17. Deanna states that patient was seen in the office this week and is being treated for acid reflux. States patient is not getting better. States that she was looking up other things that might cause this, and mentions a hernia. Would like to know if this is possibly the cause? Also, would like to know if nutritional shakes could be sent to the pharmacy? States that the patient is not able to keep anything down. [facility] [address] [place], [place] - [id number] CB#: [phone]

## 2017-12-23 NOTE — Telephone Encounter (Signed)
Spoke with patient girlfriend she states he took a muscle relaxer and all his symptoms went away.  Advise to come back in if symptoms resume

## 2017-12-26 ENCOUNTER — Telehealth: Payer: Self-pay | Admitting: Emergency Medicine

## 2017-12-26 NOTE — Telephone Encounter (Signed)
I left a voicemail for William Zuniga in regards to [facility] Will need to reschedule because the provider will not be in the office on that day.

## 2018-01-01 ENCOUNTER — Ambulatory Visit: Payer: Medicaid Other | Admitting: Podiatry

## 2018-01-07 ENCOUNTER — Ambulatory Visit: Payer: Medicaid Other | Admitting: Emergency Medicine

## 2018-01-07 DIAGNOSIS — Z0271 Encounter for disability determination: Secondary | ICD-10-CM

## 2018-01-14 ENCOUNTER — Ambulatory Visit: Payer: Medicaid Other | Admitting: Emergency Medicine

## 2018-01-16 ENCOUNTER — Ambulatory Visit (INDEPENDENT_AMBULATORY_CARE_PROVIDER_SITE_OTHER): Payer: Medicaid Other | Admitting: Podiatry

## 2018-01-16 ENCOUNTER — Encounter: Payer: Self-pay | Admitting: Podiatry

## 2018-01-16 VITALS — BP 181/111 | HR 74 | Ht 70.0 in | Wt 257.0 lb

## 2018-01-16 DIAGNOSIS — B353 Tinea pedis: Secondary | ICD-10-CM | POA: Diagnosis not present

## 2018-01-16 DIAGNOSIS — L853 Xerosis cutis: Secondary | ICD-10-CM | POA: Diagnosis not present

## 2018-01-16 NOTE — Patient Instructions (Signed)
Seen for peeling skin on bottom of both feet. Possible due to an episode of swelling and has been resolved. Scrub both feet with antibacterial soap after each shower to remove dry peeling skin. Return as needed.

## 2018-01-16 NOTE — Progress Notes (Signed)
SUBJECTIVE: 37 y.o. year old male presents complaining of peeling skin under both feet x 2-3 months. Patient recalls having this problem since taking a new medication for diabetes. Patient is legally blind and accompanied by his wife. Diabetic since 2006.  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  Review of Systems  Constitutional: Negative.   HENT: Negative.   Eyes:       Legally blind.  Respiratory: Negative.   Cardiovascular: Negative.   Gastrointestinal: Negative.   Genitourinary: Negative.   Musculoskeletal: Negative.   Skin: Negative.   Neurological:       Idiopathic intracranial hypertension.     OBJECTIVE: DERMATOLOGIC EXAMINATION: Hypertrophic nails x 10. Patches of peeling skin at plantar heel and lateral aspect, thick and broad.  VASCULAR EXAMINATION OF LOWER LIMBS: All pedal pulses are palpable with normal pulsation.  Capillary Filling times within 3 seconds in all digits.  No edema or erythema noted. Temperature gradient from tibial crest to dorsum of foot is within normal bilateral.  NEUROLOGIC EXAMINATION OF THE LOWER LIMBS: All epicritic and tactile sensations grossly intact. Sharp and Dull discriminatory sensations at the plantar ball of hallux is intact bilateral.   MUSCULOSKELETAL EXAMINATION: Positive for hypermobile first ray bilateral.  ASSESSMENT: Dry peeling skin. R/O Tinea pedis. Hypermobile first ray bilateral. Diabetic under control.  PLAN: Reviewed findings and available treatment options. Advised to scrub the peeling skin off using antibacterial soap at the end of each shower. Return as needed.

## 2018-01-24 ENCOUNTER — Other Ambulatory Visit: Payer: Self-pay

## 2018-01-24 ENCOUNTER — Encounter: Payer: Self-pay | Admitting: Emergency Medicine

## 2018-01-24 ENCOUNTER — Ambulatory Visit (INDEPENDENT_AMBULATORY_CARE_PROVIDER_SITE_OTHER): Payer: Medicaid Other | Admitting: Emergency Medicine

## 2018-01-24 VITALS — BP 147/95 | HR 65 | Temp 98.8°F | Resp 17 | Ht 70.0 in | Wt 267.8 lb

## 2018-01-24 DIAGNOSIS — H6123 Impacted cerumen, bilateral: Secondary | ICD-10-CM | POA: Diagnosis not present

## 2018-01-24 NOTE — Patient Instructions (Addendum)
   IF you received an x-ray today, you will receive an invoice from [facility]. Please contact [facility] at [phone] with questions or concerns regarding your invoice.   IF you received labwork today, you will receive an invoice from [facility]. Please contact [facility] at [phone] with questions or concerns regarding your invoice.   Our billing staff will not be able to assist you with questions regarding bills from these companies.  You will be contacted with the lab results as soon as they are available. The fastest way to get your results is to activate your My Chart account. Instructions are located on the last page of this paperwork. If you have not heard from us regarding the results in 2 weeks, please contact this office.     Earwax Buildup, Adult The ears produce a substance called earwax that helps keep bacteria out of the ear and protects the skin in the ear canal. Occasionally, earwax can build up in the ear and cause discomfort or hearing loss. What increases the risk? This condition is more likely to develop in people who:  Are male.  Are elderly.  Naturally produce more earwax.  Clean their ears often with cotton swabs.  Use earplugs often.  Use in-ear headphones often.  Wear hearing aids.  Have narrow ear canals.  Have earwax that is overly thick or sticky.  Have eczema.  Are dehydrated.  Have excess hair in the ear canal.  What are the signs or symptoms? Symptoms of this condition include:  Reduced or muffled hearing.  A feeling of fullness in the ear or feeling that the ear is plugged.  Fluid coming from the ear.  Ear pain.  Ear itch.  Ringing in the ear.  Coughing.  An obvious piece of earwax that can be seen inside the ear canal.  How is this diagnosed? This condition may be diagnosed based on:  Your symptoms.  Your medical history.  An ear exam. During the exam, your health care provider will look  into your ear with an instrument called an otoscope.  You may have tests, including a hearing test. How is this treated? This condition may be treated by:  Using ear drops to soften the earwax.  Having the earwax removed by a health care provider. The health care provider may: ? Flush the ear with water. ? Use an instrument that has a loop on the end (curette). ? Use a suction device.  Surgery to remove the wax buildup. This may be done in severe cases.  Follow these instructions at home:  Take over-the-counter and prescription medicines only as told by your health care provider.  Do not put any objects, including cotton swabs, into your ear. You can clean the opening of your ear canal with a washcloth or facial tissue.  Follow instructions from your health care provider about cleaning your ears. Do not over-clean your ears.  Drink enough fluid to keep your urine clear or pale yellow. This will help to thin the earwax.  Keep all follow-up visits as told by your health care provider. If earwax builds up in your ears often or if you use hearing aids, consider seeing your health care provider for routine, preventive ear cleanings. Ask your health care provider how often you should schedule your cleanings.  If you have hearing aids, clean them according to instructions from the manufacturer and your health care provider. Contact a health care provider if:  You have ear pain.  You develop   a fever.  You have blood, pus, or other fluid coming from your ear.  You have hearing loss.  You have ringing in your ears that does not go away.  Your symptoms do not improve with treatment.  You feel like the room is spinning (vertigo). Summary  Earwax can build up in the ear and cause discomfort or hearing loss.  The most common symptoms of this condition include reduced or muffled hearing and a feeling of fullness in the ear or feeling that the ear is plugged.  This condition may be  diagnosed based on your symptoms, your medical history, and an ear exam.  This condition may be treated by using ear drops to soften the earwax or by having the earwax removed by a health care provider.  Do not put any objects, including cotton swabs, into your ear. You can clean the opening of your ear canal with a washcloth or facial tissue. This information is not intended to replace advice given to you by your health care provider. Make sure you discuss any questions you have with your health care provider. Document Released: 08/02/2004 Document Revised: 09/05/2016 Document Reviewed: 09/05/2016 Elsevier Interactive Patient Education  2018 Elsevier Inc.  

## 2018-01-24 NOTE — Progress Notes (Signed)
William Zuniga 37 y.o.   Chief Complaint  Patient presents with  . Hearing Loss    lots of wax for awhile.  Terrible the past few weeks, one is worse than the other pt unsure which one    HISTORY OF PRESENT ILLNESS: This is a 37 y.o. male complaining of wax buildup in both ears affecting his hearing.  Diabetic and hypertensive, visually impaired.  HPI   Prior to Admission medications   Medication Sig Start Date End Date Taking? Authorizing Provider  acetaminophen (TYLENOL) 500 MG tablet Take 2 tablets (1,000 mg total) by mouth every 6 (six) hours as needed for mild pain, moderate pain or fever. 07/30/17  Yes Rice, Resa Miner, MD  acetaZOLAMIDE (DIAMOX) 500 MG capsule Take 500 mg by mouth 2 (two) times daily.   Yes [provider]  blood glucose meter kit and supplies KIT Dispense based on patient and insurance preference. Use up to four times daily as directed. (FOR ICD-9 250.00, 250.01). 11/29/17  Yes Janelle Culton, Ines Bloomer, MD  carvedilol (COREG) 12.5 MG tablet Take 1 tablet (12.5 mg total) by mouth 2 (two) times daily with a meal. 11/29/17  Yes Curry Dulski, Ines Bloomer, MD  lisinopril (PRINIVIL,ZESTRIL) 10 MG tablet Take 1 tablet (10 mg total) by mouth daily. Patient taking differently: Take 40 mg by mouth daily.  11/29/17  Yes Danyka Merlin, Ines Bloomer, MD  omeprazole (PRILOSEC) 40 MG capsule Take 1 capsule (40 mg total) by mouth daily. 12/18/17  Yes Shantese Raven, Ines Bloomer, MD  ondansetron (ZOFRAN) 4 MG tablet Take 1 tablet (4 mg total) by mouth every 8 (eight) hours as needed for nausea or vomiting. 12/18/17  Yes Jayne Peckenpaugh, Ines Bloomer, MD  acetaZOLAMIDE (DIAMOX) 250 MG tablet Take 2 tablets (500 mg total) by mouth 2 (two) times daily. 09/09/17 12/08/17  Horald Pollen, MD  amLODipine (NORVASC) 10 MG tablet Take 1 tablet (10 mg total) by mouth daily. 08/30/17 11/28/17  Horald Pollen, MD  glipiZIDE (GLUCOTROL) 5 MG tablet Take 1 tablet (5 mg total) by mouth 2 (two) times  daily before a meal. 11/29/17 02/27/18  Arieon Corcoran, Ines Bloomer, MD  insulin aspart (NOVOLOG) 100 UNIT/ML injection Inject 0-5 Units into the skin at bedtime. Patient not taking: Reported on 01/24/2018 07/31/17   Collier Salina, MD  labetalol (NORMODYNE,TRANDATE) 5 MG/ML injection Inject 2 mLs (10 mg total) into the vein every 6 (six) hours as needed (SBP > 160). Patient not taking: Reported on 01/24/2018 07/30/17   Collier Salina, MD  potassium chloride (K-DUR,KLOR-CON) 10 MEQ tablet Take 1 tablet (10 mEq total) by mouth 2 (two) times daily. 08/30/17 11/28/17  Horald Pollen, MD    Allergies  Allergen Reactions  . Shellfish Allergy     Patient Active Problem List   Diagnosis Date Noted  . Intractable vomiting with nausea 12/18/2017  . GERD with esophagitis 12/18/2017  . Intractable hiccups 12/18/2017  . Legally blind 12/17/2017  . Type 2 diabetes mellitus with hyperglycemia, without long-term current use of insulin ([place]) 11/29/2017  . Essential hypertension, malignant 08/30/2017  . Idiopathic intracranial hypertension 08/30/2017  . History of hypokalemia 08/30/2017  . Uncontrolled hypertension 07/26/2017  . 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 PRES (posterior reversible encephalopathy syndrome) 07/26/2017  . 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 Diabetes mellitus without complication ([place])   . Hypertension   . IIH (idiopathic intracranial hypertension)     Past Surgical  History:  Procedure Laterality Date  . EYE SURGERY      Social History   Socioeconomic History  . Marital status: Married    Spouse name: Not on file  . Number of children: 7  . Years of education: Not on file  . Highest education level: Not on file  Occupational History  . Not on file  Social Needs  . Financial resource strain: Not on file  . Food insecurity:    Worry: Not on file    Inability: Not on file  . Transportation  needs:    Medical: Not on file    Non-medical: Not on file  Tobacco Use  . Smoking status: Never Smoker  . Smokeless tobacco: Never Used  Substance and Sexual Activity  . Alcohol use: No    Frequency: Never  . Drug use: Yes    Types: Marijuana  . Sexual activity: Yes  Lifestyle  . Physical activity:    Days per week: Not on file    Minutes per session: Not on file  . Stress: Not on file  Relationships  . Social connections:    Talks on phone: Not on file    Gets together: Not on file    Attends religious service: Not on file    Active member of club or organization: Not on file    Attends meetings of clubs or organizations: Not on file    Relationship status: Not on file  . Intimate partner violence:    Fear of current or ex partner: Not on file    Emotionally abused: Not on file    Physically abused: Not on file    Forced sexual activity: Not on file  Other Topics Concern  . Not on file  Social History Narrative  . Not on file    No family history on file.   Review of Systems  Constitutional: Negative for chills and fever.  HENT: Positive for hearing loss.   Respiratory: Negative for shortness of breath.   Cardiovascular: Negative for chest pain and palpitations.  Gastrointestinal: Negative for nausea and vomiting.  Skin: Negative for rash.  Neurological: Negative for dizziness and headaches.  Endo/Heme/Allergies: Negative.   All other systems reviewed and are negative.  Vitals:   01/24/18 1739  BP: (!) 147/95  Pulse: 65  Resp: 17  Temp: 98.8 F (37.1 C)     Physical Exam  Constitutional: He is oriented to person, place, and time. He appears well-developed and well-nourished.  HENT:  Head: Normocephalic and atraumatic.  Both ear canals impacted with cerumen.  Neck: Normal range of motion. Neck supple.  Cardiovascular: Normal rate and regular rhythm.  Pulmonary/Chest: Effort normal.  Musculoskeletal: Normal range of motion.  Neurological: He is  alert and oriented to person, place, and time.  Skin: Skin is warm and dry.  Psychiatric: He has a normal mood and affect. His behavior is normal.  Vitals reviewed.  Ceruminosis is noted.  Wax is removed by syringing and manual debridement. Instructions for home care to prevent wax buildup are given.   ASSESSMENT & PLAN: Rayne was seen today for hearing loss.  Diagnoses and all orders for this visit:  Ceruminosis, bilateral -     Ear Lavage  Hearing loss of both ears due to cerumen impaction    Patient Instructions       IF you received an x-ray today, you will receive an invoice from [facility]. Please contact [facility] at [phone] with questions or concerns  regarding your invoice.   IF you received labwork today, you will receive an invoice from [facility]. Please contact [facility] at [phone] with questions or concerns regarding your invoice.   Our billing staff will not be able to assist you with questions regarding bills from these companies.  You will be contacted with the lab results as soon as they are available. The fastest way to get your results is to activate your My Chart account. Instructions are located on the last page of this paperwork. If you have not heard from [place] regarding the results in 2 weeks, please contact this office.     Earwax Buildup, Adult The ears produce a substance called earwax that helps keep bacteria out of the ear and protects the skin in the ear canal. Occasionally, earwax can build up in the ear and cause discomfort or hearing loss. What increases the risk? This condition is more likely to develop in people who:  Are male.  Are elderly.  Naturally produce more earwax.  Clean their ears often with cotton swabs.  Use earplugs often.  Use in-ear headphones often.  Wear hearing aids.  Have narrow ear canals.  Have earwax that is overly thick or sticky.  Have eczema.  Are dehydrated.  Have  excess hair in the ear canal.  What are the signs or symptoms? Symptoms of this condition include:  Reduced or muffled hearing.  A feeling of fullness in the ear or feeling that the ear is plugged.  Fluid coming from the ear.  Ear pain.  Ear itch.  Ringing in the ear.  Coughing.  An obvious piece of earwax that can be seen inside the ear canal.  How is this diagnosed? This condition may be diagnosed based on:  Your symptoms.  Your medical history.  An ear exam. During the exam, your health care provider will look into your ear with an instrument called an otoscope.  You may have tests, including a hearing test. How is this treated? This condition may be treated by:  Using ear drops to soften the earwax.  Having the earwax removed by a health care provider. The health care provider may: ? Flush the ear with water. ? Use an instrument that has a loop on the end (curette). ? Use a suction device.  Surgery to remove the wax buildup. This may be done in severe cases.  Follow these instructions at home:  Take over-the-counter and prescription medicines only as told by your health care provider.  Do not put any objects, including cotton swabs, into your ear. You can clean the opening of your ear canal with a washcloth or facial tissue.  Follow instructions from your health care provider about cleaning your ears. Do not over-clean your ears.  Drink enough fluid to keep your urine clear or pale yellow. This will help to thin the earwax.  Keep all follow-up visits as told by your health care provider. If earwax builds up in your ears often or if you use hearing aids, consider seeing your health care provider for routine, preventive ear cleanings. Ask your health care provider how often you should schedule your cleanings.  If you have hearing aids, clean them according to instructions from the manufacturer and your health care provider. Contact a health care provider  if:  You have ear pain.  You develop a fever.  You have blood, pus, or other fluid coming from your ear.  You have hearing loss.  You have ringing in your ears  that does not go away.  Your symptoms do not improve with treatment.  You feel like the room is spinning (vertigo). Summary  Earwax can build up in the ear and cause discomfort or hearing loss.  The most common symptoms of this condition include reduced or muffled hearing and a feeling of fullness in the ear or feeling that the ear is plugged.  This condition may be diagnosed based on your symptoms, your medical history, and an ear exam.  This condition may be treated by using ear drops to soften the earwax or by having the earwax removed by a health care provider.  Do not put any objects, including cotton swabs, into your ear. You can clean the opening of your ear canal with a washcloth or facial tissue. This information is not intended to replace advice given to you by your health care provider. Make sure you discuss any questions you have with your health care provider. Document Released: 08/02/2004 Document Revised: 09/05/2016 Document Reviewed: 09/05/2016 Elsevier Interactive Patient Education  2018 [employer]vier Inc.      Agustina Caroli, MD [facility]

## 2018-02-06 ENCOUNTER — Ambulatory Visit
Admission: RE | Admit: 2018-02-06 | Discharge: 2018-02-06 | Disposition: A | Discharge status: Home / Self Care | Payer: Medicaid Other | Source: Ambulatory Visit | Attending: Nephrology | Admitting: Nephrology

## 2018-02-06 DIAGNOSIS — E118 Type 2 diabetes mellitus with unspecified complications: Secondary | ICD-10-CM

## 2018-02-06 DIAGNOSIS — E872 Acidosis, unspecified: Secondary | ICD-10-CM

## 2018-02-06 DIAGNOSIS — N183 Chronic kidney disease, stage 3 unspecified: Secondary | ICD-10-CM

## 2018-02-06 DIAGNOSIS — I159 Secondary hypertension, unspecified: Secondary | ICD-10-CM

## 2018-02-20 ENCOUNTER — Telehealth: Payer: Self-pay | Admitting: Emergency Medicine

## 2018-02-20 ENCOUNTER — Encounter: Payer: Self-pay | Admitting: Neurology

## 2018-02-20 ENCOUNTER — Telehealth: Payer: Self-pay | Admitting: *Deleted

## 2018-02-20 ENCOUNTER — Ambulatory Visit: Payer: Medicaid Other | Admitting: Neurology

## 2018-02-20 NOTE — Telephone Encounter (Signed)
Patient no showed new pt appt on 02/20/2018 @ 8:00 AM.

## 2018-02-21 ENCOUNTER — Ambulatory Visit: Payer: Medicaid Other | Admitting: Emergency Medicine

## 2018-05-01 NOTE — Telephone Encounter (Signed)
done

## 2018-06-13 ENCOUNTER — Other Ambulatory Visit: Payer: Self-pay | Admitting: Emergency Medicine

## 2019-02-10 ENCOUNTER — Other Ambulatory Visit: Payer: Self-pay | Admitting: Emergency Medicine

## 2019-02-10 ENCOUNTER — Other Ambulatory Visit (INDEPENDENT_AMBULATORY_CARE_PROVIDER_SITE_OTHER): Payer: Self-pay

## 2019-02-10 DIAGNOSIS — Z8639 Personal history of other endocrine, nutritional and metabolic disease: Secondary | ICD-10-CM

## 2019-02-10 DIAGNOSIS — I10 Essential (primary) hypertension: Secondary | ICD-10-CM

## 2019-02-10 DIAGNOSIS — G932 Benign intracranial hypertension: Secondary | ICD-10-CM

## 2019-02-10 DIAGNOSIS — E1165 Type 2 diabetes mellitus with hyperglycemia: Secondary | ICD-10-CM

## 2019-02-10 MED ORDER — POTASSIUM CHLORIDE CRYS ER 10 MEQ PO TBCR: 10.0000 meq | EXTENDED_RELEASE_TABLET | Freq: Two times a day (BID) | ORAL | 0 refills | Status: DC

## 2019-02-10 MED ORDER — CARVEDILOL 12.5 MG PO TABS: 12.5000 mg | ORAL_TABLET | Freq: Two times a day (BID) | ORAL | 0 refills | Status: DC

## 2019-02-10 MED ORDER — ACETAZOLAMIDE 250 MG PO TABS
500.0000 mg | ORAL_TABLET | Freq: Two times a day (BID) | ORAL | 0 refills | Status: DC
Start: 2019-02-10 — End: 2019-03-17

## 2019-02-10 MED ORDER — BLOOD GLUCOSE MONITOR KIT: PACK | 0 refills | Status: AC

## 2019-02-10 MED ORDER — AMLODIPINE BESYLATE 10 MG PO TABS: 10.0000 mg | ORAL_TABLET | Freq: Every day | ORAL | 0 refills | Status: DC

## 2019-02-10 MED ORDER — GLIPIZIDE 5 MG PO TABS: 5.0000 mg | ORAL_TABLET | Freq: Two times a day (BID) | ORAL | 0 refills | Status: DC

## 2019-02-10 NOTE — Telephone Encounter (Signed)
Attempted to call pt  No answer. VM not set up   I have refilled majority of his medications for a 30 day supply. He needs office visit for additional Refills.

## 2019-02-11 ENCOUNTER — Encounter: Payer: Self-pay | Admitting: Emergency Medicine

## 2019-02-16 ENCOUNTER — Encounter: Payer: Self-pay | Admitting: Emergency Medicine

## 2019-02-17 ENCOUNTER — Other Ambulatory Visit: Payer: Self-pay

## 2019-02-17 ENCOUNTER — Ambulatory Visit (INDEPENDENT_AMBULATORY_CARE_PROVIDER_SITE_OTHER): Payer: Self-pay | Admitting: Emergency Medicine

## 2019-02-17 ENCOUNTER — Encounter: Payer: Self-pay | Admitting: Emergency Medicine

## 2019-02-17 VITALS — BP 160/110 | HR 76 | Temp 98.4°F | Resp 16 | Ht 69.0 in | Wt 271.2 lb

## 2019-02-17 DIAGNOSIS — E1165 Type 2 diabetes mellitus with hyperglycemia: Secondary | ICD-10-CM

## 2019-02-17 DIAGNOSIS — I10 Essential (primary) hypertension: Secondary | ICD-10-CM

## 2019-02-17 DIAGNOSIS — Z23 Encounter for immunization: Secondary | ICD-10-CM

## 2019-02-17 DIAGNOSIS — G932 Benign intracranial hypertension: Secondary | ICD-10-CM

## 2019-02-17 MED ORDER — LISINOPRIL 40 MG PO TABS: 40.0000 mg | ORAL_TABLET | Freq: Every day | ORAL | 3 refills | Status: DC

## 2019-02-17 NOTE — Patient Instructions (Addendum)
   If you have lab work done today you will be contacted with your lab results within the next 2 weeks.  If you have not heard from us then please contact us. The fastest way to get your results is to register for My Chart.   IF you received an x-ray today, you will receive an invoice from [facility]. Please contact [facility] at [phone] with questions or concerns regarding your invoice.   IF you received labwork today, you will receive an invoice from [facility]. Please contact [facility] at [phone] with questions or concerns regarding your invoice.   Our billing staff will not be able to assist you with questions regarding bills from these companies.  You will be contacted with the lab results as soon as they are available. The fastest way to get your results is to activate your My Chart account. Instructions are located on the last page of this paperwork. If you have not heard from us regarding the results in 2 weeks, please contact this office.     Hypertension, Adult High blood pressure (hypertension) is when the force of blood pumping through the arteries is too strong. The arteries are the blood vessels that carry blood from the heart throughout the body. Hypertension forces the heart to work harder to pump blood and may cause arteries to become narrow or stiff. Untreated or uncontrolled hypertension can cause a heart attack, heart failure, a stroke, kidney disease, and other problems. A blood pressure reading consists of a higher number over a lower number. Ideally, your blood pressure should be below 120/80. The first ("top") number is called the systolic pressure. It is a measure of the pressure in your arteries as your heart beats. The second ("bottom") number is called the diastolic pressure. It is a measure of the pressure in your arteries as the heart relaxes. What are the causes? The exact cause of this condition is not known. There are some conditions  that result in or are related to high blood pressure. What increases the risk? Some risk factors for high blood pressure are under your control. The following factors may make you more likely to develop this condition:  Smoking.  Having type 2 diabetes mellitus, high cholesterol, or both.  Not getting enough exercise or physical activity.  Being overweight.  Having too much fat, sugar, calories, or salt (sodium) in your diet.  Drinking too much alcohol. Some risk factors for high blood pressure may be difficult or impossible to change. Some of these factors include:  Having chronic kidney disease.  Having a family history of high blood pressure.  Age. Risk increases with age.  Race. You may be at higher risk if you are African American.  Gender. Men are at higher risk than women before age 45. After age 65, women are at higher risk than men.  Having obstructive sleep apnea.  Stress. What are the signs or symptoms? High blood pressure may not cause symptoms. Very high blood pressure (hypertensive crisis) may cause:  Headache.  Anxiety.  Shortness of breath.  Nosebleed.  Nausea and vomiting.  Vision changes.  Severe chest pain.  Seizures. How is this diagnosed? This condition is diagnosed by measuring your blood pressure while you are seated, with your arm resting on a flat surface, your legs uncrossed, and your feet flat on the floor. The cuff of the blood pressure monitor will be placed directly against the skin of your upper arm at the level of your heart.   It should be measured at least twice using the same arm. Certain conditions can cause a difference in blood pressure between your right and left arms. Certain factors can cause blood pressure readings to be lower or higher than normal for a short period of time:  When your blood pressure is higher when you are in a health care provider's office than when you are at home, this is called white coat hypertension.  Most people with this condition do not need medicines.  When your blood pressure is higher at home than when you are in a health care provider's office, this is called masked hypertension. Most people with this condition may need medicines to control blood pressure. If you have a high blood pressure reading during one visit or you have normal blood pressure with other risk factors, you may be asked to:  Return on a different day to have your blood pressure checked again.  Monitor your blood pressure at home for 1 week or longer. If you are diagnosed with hypertension, you may have other blood or imaging tests to help your health care provider understand your overall risk for other conditions. How is this treated? This condition is treated by making healthy lifestyle changes, such as eating healthy foods, exercising more, and reducing your alcohol intake. Your health care provider may prescribe medicine if lifestyle changes are not enough to get your blood pressure under control, and if:  Your systolic blood pressure is above 130.  Your diastolic blood pressure is above 80. Your personal target blood pressure may vary depending on your medical conditions, your age, and other factors. Follow these instructions at home: Eating and drinking   Eat a diet that is high in fiber and potassium, and low in sodium, added sugar, and fat. An example eating plan is called the DASH (Dietary Approaches to Stop Hypertension) diet. To eat this way: ? Eat plenty of fresh fruits and vegetables. Try to fill one half of your plate at each meal with fruits and vegetables. ? Eat whole grains, such as whole-wheat pasta, brown rice, or whole-grain bread. Fill about one fourth of your plate with whole grains. ? Eat or drink low-fat dairy products, such as skim milk or low-fat yogurt. ? Avoid fatty cuts of meat, processed or cured meats, and poultry with skin. Fill about one fourth of your plate with lean proteins, such  as fish, chicken without skin, beans, eggs, or tofu. ? Avoid pre-made and processed foods. These tend to be higher in sodium, added sugar, and fat.  Reduce your daily sodium intake. Most people with hypertension should eat less than 1,500 mg of sodium a day.  Do not drink alcohol if: ? Your health care provider tells you not to drink. ? You are pregnant, may be pregnant, or are planning to become pregnant.  If you drink alcohol: ? Limit how much you use to:  0-1 drink a day for women.  0-2 drinks a day for men. ? Be aware of how much alcohol is in your drink. In the [place] one drink equals one 12 oz bottle of beer (355 mL), one 5 oz glass of wine (148 mL), or one 1 oz glass of hard liquor (44 mL). Lifestyle   Work with your health care provider to maintain a healthy body weight or to lose weight. Ask what an ideal weight is for you.  Get at least 30 minutes of exercise most days of the week. Activities may include walking, swimming, or   biking.  Include exercise to strengthen your muscles (resistance exercise), such as Pilates or lifting weights, as part of your weekly exercise routine. Try to do these types of exercises for 30 minutes at least 3 days a week.  Do not use any products that contain nicotine or tobacco, such as cigarettes, e-cigarettes, and chewing tobacco. If you need help quitting, ask your health care provider.  Monitor your blood pressure at home as told by your health care provider.  Keep all follow-up visits as told by your health care provider. This is important. Medicines  Take over-the-counter and prescription medicines only as told by your health care provider. Follow directions carefully. Blood pressure medicines must be taken as prescribed.  Do not skip doses of blood pressure medicine. Doing this puts you at risk for problems and can make the medicine less effective.  Ask your health care provider about side effects or reactions to medicines that you  should watch for. Contact a health care provider if you:  Think you are having a reaction to a medicine you are taking.  Have headaches that keep coming back (recurring).  Feel dizzy.  Have swelling in your ankles.  Have trouble with your vision. Get help right away if you:  Develop a severe headache or confusion.  Have unusual weakness or numbness.  Feel faint.  Have severe pain in your chest or abdomen.  Vomit repeatedly.  Have trouble breathing. Summary  Hypertension is when the force of blood pumping through your arteries is too strong. If this condition is not controlled, it may put you at risk for serious complications.  Your personal target blood pressure may vary depending on your medical conditions, your age, and other factors. For most people, a normal blood pressure is less than 120/80.  Hypertension is treated with lifestyle changes, medicines, or a combination of both. Lifestyle changes include losing weight, eating a healthy, low-sodium diet, exercising more, and limiting alcohol. This information is not intended to replace advice given to you by your health care provider. Make sure you discuss any questions you have with your health care provider. Document Released: 06/25/2005 Document Revised: 03/05/2018 Document Reviewed: 03/05/2018 [employer] Patient Education  2020 [employer]  

## 2019-02-17 NOTE — Progress Notes (Signed)
BP Readings from Last 3 Encounters:  02/17/19 (!) 167/113  01/24/18 (!) 147/95  01/16/18 (!) 181/111   Lab Results  Component Value Date   HGBA1C 12.5 (A) 11/29/2017   William Zuniga 38 y.o.   Chief Complaint  Patient presents with   Diabetes   Medication Refill    LISINOPRIL   Hypertension    HISTORY OF PRESENT ILLNESS: This is a 38 y.o. 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  States he was off blood pressure medication for several months and just restarted taking them 1 week ago.  Takes lisinopril 40 mg daily, carvedilol 12.5 mg twice a day, amlodipine 10 mg daily, acetazolamide 500 mg twice a day.  Also has a history of poorly controlled diabetes, taken off metformin due to renal problems, on glipizide 5 mg twice a day.  No episodes of hypoglycemia.  Last seen by me on July 2019.  Has no complaints or medical concerns today.  New caretaker with him today.   HPI   Prior to Admission medications   Medication Sig Start Date End Date Taking? Authorizing Provider  acetaZOLAMIDE (DIAMOX) 500 MG capsule Take 500 mg by mouth 2 (two) times daily.   Yes [provider]  amLODipine (NORVASC) 10 MG tablet Take 1 tablet (10 mg total) by mouth daily. MUST BE SEEN IN OFFICE FOR ADDITIONAL REFILLS 02/10/19 03/12/19 Yes Horald Pollen, MD  carvedilol (COREG) 12.5 MG tablet Take 1 tablet (12.5 mg total) by mouth 2 (two) times daily with a meal. 02/10/19 03/12/19 Yes Stephane Junkins, Ines Bloomer, MD  glipiZIDE (GLUCOTROL) 5 MG tablet Take 1 tablet (5 mg total) by mouth 2 (two) times daily before a meal. 02/10/19 03/12/19 Yes Lashayla Armes, Ines Bloomer, MD  potassium chloride (K-DUR) 10 MEQ tablet Take 1 tablet (10 mEq total) by mouth 2 (two) times daily. MUST BE SEEN IN OFFICE FOR ADDITIONAL REFILLS 02/10/19 03/12/19 Yes Shai Mckenzie, Ines Bloomer, MD  acetaminophen (TYLENOL) 500 MG tablet Take 2  tablets (1,000 mg total) by mouth every 6 (six) hours as needed for mild pain, moderate pain or fever. Patient not taking: Reported on 02/17/2019 07/30/17   Collier Salina, MD  acetaZOLAMIDE (DIAMOX) 250 MG tablet Take 2 tablets (500 mg total) by mouth 2 (two) times daily. 02/10/19 03/12/19  Horald Pollen, MD  blood glucose meter kit and supplies KIT Dispense based on patient and insurance preference. Use up to four times daily as directed. (FOR ICD-9 250.00, 250.01). 02/10/19   Horald Pollen, MD  labetalol (NORMODYNE,TRANDATE) 5 MG/ML injection Inject 2 mLs (10 mg total) into the vein every 6 (six) hours as needed (SBP > 160). Patient not taking: Reported on 01/24/2018 07/30/17   Collier Salina, MD  lisinopril (ZESTRIL) 40 MG tablet Take 1 tablet (40 mg total) by mouth daily. 02/17/19   Horald Pollen, MD  omeprazole (PRILOSEC) 40 MG capsule Take 1 capsule (40 mg total) by mouth daily. Patient not taking: Reported on 02/17/2019 12/18/17   Horald Pollen, MD  ondansetron (ZOFRAN) 4 MG tablet Take 1 tablet (4 mg total) by mouth every 8 (eight) hours as needed for nausea or vomiting. Patient not taking: Reported on 02/17/2019 12/18/17   Horald Pollen, MD    Allergies  Allergen Reactions   Shellfish Allergy     Patient Active Problem List   Diagnosis Date Noted   GERD with esophagitis 12/18/2017   Legally  blind 12/17/2017   Type 2 diabetes mellitus with hyperglycemia, without long-term current use of insulin ([place]) 11/29/2017   Essential hypertension, malignant 08/30/2017   Idiopathic intracranial hypertension 08/30/2017   History of hypokalemia 08/30/2017   Uncontrolled hypertension 07/26/2017   Papilledema 07/26/2017   MODY (maturity onset diabetes mellitus in young) ([place]) 07/26/2017    Past Medical History:  Diagnosis Date   Diabetes mellitus without complication (HCC)    Hypertension    IIH (idiopathic intracranial hypertension)      Past Surgical History:  Procedure Laterality Date   EYE SURGERY      Social History   Socioeconomic History   Marital status: Single    Spouse name: Not on file   Number of children: 7   Years of education: Not on file   Highest education level: Not on file  Occupational History   Not on file  Social Needs   Financial resource strain: Not on file   Food insecurity    Worry: Not on file    Inability: Not on file   Transportation needs    Medical: Not on file    Non-medical: Not on file  Tobacco Use   Smoking status: Never Smoker   Smokeless tobacco: Never Used  Substance and Sexual Activity   Alcohol use: No    Frequency: Never   Drug use: Yes    Types: Marijuana   Sexual activity: Yes  Lifestyle   Physical activity    Days per week: Not on file    Minutes per session: Not on file   Stress: Not on file  Relationships   Social connections    Talks on phone: Not on file    Gets together: Not on file    Attends religious service: Not on file    Active member of club or organization: Not on file    Attends meetings of clubs or organizations: Not on file    Relationship status: Not on file   Intimate partner violence    Fear of current or ex partner: Not on file    Emotionally abused: Not on file    Physically abused: Not on file    Forced sexual activity: Not on file  Other Topics Concern   Not on file  Social History Narrative   Not on file    History reviewed. No pertinent family history.   Review of Systems  Constitutional: Negative.  Negative for chills and fever.  HENT: Negative.  Negative for congestion and sore throat.   Eyes:       Legally blind  Respiratory: Negative.  Negative for cough and shortness of breath.   Cardiovascular: Negative for chest pain and palpitations.  Gastrointestinal: Negative for abdominal pain, nausea and vomiting.  Genitourinary: Negative.  Negative for dysuria and hematuria.  Musculoskeletal:  Negative.  Negative for myalgias and neck pain.  Skin: Negative.  Negative for rash.  Neurological: Negative for dizziness and headaches.  Endo/Heme/Allergies: Negative.   All other systems reviewed and are negative.  Vitals:   02/17/19 1601 02/17/19 1651  BP: (!) 167/113 (!) 160/110  Pulse: 76   Resp: 16   Temp: 98.4 F (36.9 C)   SpO2: 98%      Physical Exam Vitals signs reviewed.  Constitutional:      Appearance: Normal appearance.  HENT:     Head: Normocephalic.  Neck:     Musculoskeletal: Normal range of motion and neck supple.  Cardiovascular:     Rate and  Rhythm: Normal rate and regular rhythm.     Heart sounds: Normal heart sounds.  Pulmonary:     Effort: Pulmonary effort is normal.     Breath sounds: Normal breath sounds.  Abdominal:     Palpations: Abdomen is soft.     Tenderness: There is no abdominal tenderness.  Musculoskeletal: Normal range of motion.  Skin:    General: Skin is warm and dry.     Capillary Refill: Capillary refill takes less than 2 seconds.  Neurological:     General: No focal deficit present.     Mental Status: He is alert and oriented to person, place, and time.  Psychiatric:        Mood and Affect: Mood normal.        Behavior: Behavior normal.      ASSESSMENT & PLAN: Jiyan was seen today for diabetes, medication refill and hypertension.  Diagnoses and all orders for this visit:  Uncontrolled hypertension -     CBC with Differential/Platelet -     Comprehensive metabolic panel -     lisinopril (ZESTRIL) 40 MG tablet; Take 1 tablet (40 mg total) by mouth daily.  Type 2 diabetes mellitus with hyperglycemia, without long-term current use of insulin (HCC) -     Hemoglobin A1c -     Lipid panel -     HM Diabetes Foot Exam  Idiopathic intracranial hypertension  Need for prophylactic vaccination against Streptococcus pneumoniae (pneumococcus) -     Pneumococcal polysaccharide vaccine 23-valent greater than or equal to 2yo  subcutaneous/IM    Patient Instructions       If you have lab work done today you will be contacted with your lab results within the next 2 weeks.  If you have not heard from [place] then please contact us. The fastest way to get your results is to register for My Chart.   IF you received an x-ray today, you will receive an invoice from [facility]. Please contact [facility] at [phone] with questions or concerns regarding your invoice.   IF you received labwork today, you will receive an invoice from [place]. Please contact [facility] at [phone] with questions or concerns regarding your invoice.   Our billing staff will not be able to assist you with questions regarding bills from these companies.  You will be contacted with the lab results as soon as they are available. The fastest way to get your results is to activate your My Chart account. Instructions are located on the last page of this paperwork. If you have not heard from [place] regarding the results in 2 weeks, please contact this office.     Hypertension, Adult High blood pressure (hypertension) is when the force of blood pumping through the arteries is too strong. The arteries are the blood vessels that carry blood from the heart throughout the body. Hypertension forces the heart to work harder to pump blood and may cause arteries to become narrow or stiff. Untreated or uncontrolled hypertension can cause a heart attack, heart failure, a stroke, kidney disease, and other problems. A blood pressure reading consists of a higher number over a lower number. Ideally, your blood pressure should be below 120/80. The first ("top") number is called the systolic pressure. It is a measure of the pressure in your arteries as your heart beats. The second ("bottom") number is called the diastolic pressure. It is a measure of the pressure in your arteries as the heart relaxes. What are the causes? The  exact cause of this  condition is not known. There are some conditions that result in or are related to high blood pressure. What increases the risk? Some risk factors for high blood pressure are under your control. The following factors may make you more likely to develop this condition:  Smoking.  Having type 2 diabetes mellitus, high cholesterol, or both.  Not getting enough exercise or physical activity.  Being overweight.  Having too much fat, sugar, calories, or salt (sodium) in your diet.  Drinking too much alcohol. Some risk factors for high blood pressure may be difficult or impossible to change. Some of these factors include:  Having chronic kidney disease.  Having a family history of high blood pressure.  Age. Risk increases with age.  Race. You may be at higher risk if you are African American.  Gender. Men are at higher risk than women before age 18. After age 2, women are at higher risk than men.  Having obstructive sleep apnea.  Stress. What are the signs or symptoms? High blood pressure may not cause symptoms. Very high blood pressure (hypertensive crisis) may cause:  Headache.  Anxiety.  Shortness of breath.  Nosebleed.  Nausea and vomiting.  Vision changes.  Severe chest pain.  Seizures. How is this diagnosed? This condition is diagnosed by measuring your blood pressure while you are seated, with your arm resting on a flat surface, your legs uncrossed, and your feet flat on the floor. The cuff of the blood pressure monitor will be placed directly against the skin of your upper arm at the level of your heart. It should be measured at least twice using the same arm. Certain conditions can cause a difference in blood pressure between your right and left arms. Certain factors can cause blood pressure readings to be lower or higher than normal for a short period of time:  When your blood pressure is higher when you are in a health care provider's office than when you are  at home, this is called white coat hypertension. Most people with this condition do not need medicines.  When your blood pressure is higher at home than when you are in a health care provider's office, this is called masked hypertension. Most people with this condition may need medicines to control blood pressure. If you have a high blood pressure reading during one visit or you have normal blood pressure with other risk factors, you may be asked to:  Return on a different day to have your blood pressure checked again.  Monitor your blood pressure at home for 1 week or longer. If you are diagnosed with hypertension, you may have other blood or imaging tests to help your health care provider understand your overall risk for other conditions. How is this treated? This condition is treated by making healthy lifestyle changes, such as eating healthy foods, exercising more, and reducing your alcohol intake. Your health care provider may prescribe medicine if lifestyle changes are not enough to get your blood pressure under control, and if:  Your systolic blood pressure is above 130.  Your diastolic blood pressure is above 80. Your personal target blood pressure may vary depending on your medical conditions, your age, and other factors. Follow these instructions at home: Eating and drinking   Eat a diet that is high in fiber and potassium, and low in sodium, added sugar, and fat. An example eating plan is called the DASH (Dietary Approaches to Stop Hypertension) diet. To eat this way: ?  Eat plenty of fresh fruits and vegetables. Try to fill one half of your plate at each meal with fruits and vegetables. ? Eat whole grains, such as whole-wheat pasta, brown rice, or whole-grain bread. Fill about one fourth of your plate with whole grains. ? Eat or drink low-fat dairy products, such as skim milk or low-fat yogurt. ? Avoid fatty cuts of meat, processed or cured meats, and poultry with skin. Fill about  one fourth of your plate with lean proteins, such as fish, chicken without skin, beans, eggs, or tofu. ? Avoid pre-made and processed foods. These tend to be higher in sodium, added sugar, and fat.  Reduce your daily sodium intake. Most people with hypertension should eat less than 1,500 mg of sodium a day.  Do not drink alcohol if: ? Your health care provider tells you not to drink. ? You are pregnant, may be pregnant, or are planning to become pregnant.  If you drink alcohol: ? Limit how much you use to:  0-1 drink a day for women.  0-2 drinks a day for men. ? Be aware of how much alcohol is in your drink. In the [place] one drink equals one 12 oz bottle of beer (355 mL), one 5 oz glass of wine (148 mL), or one 1 oz glass of hard liquor (44 mL). Lifestyle   Work with your health care provider to maintain a healthy body weight or to lose weight. Ask what an ideal weight is for you.  Get at least 30 minutes of exercise most days of the week. Activities may include walking, swimming, or biking.  Include exercise to strengthen your muscles (resistance exercise), such as Pilates or lifting weights, as part of your weekly exercise routine. Try to do these types of exercises for 30 minutes at least 3 days a week.  Do not use any products that contain nicotine or tobacco, such as cigarettes, e-cigarettes, and chewing tobacco. If you need help quitting, ask your health care provider.  Monitor your blood pressure at home as told by your health care provider.  Keep all follow-up visits as told by your health care provider. This is important. Medicines  Take over-the-counter and prescription medicines only as told by your health care provider. Follow directions carefully. Blood pressure medicines must be taken as prescribed.  Do not skip doses of blood pressure medicine. Doing this puts you at risk for problems and can make the medicine less effective.  Ask your health care provider about  side effects or reactions to medicines that you should watch for. Contact a health care provider if you:  Think you are having a reaction to a medicine you are taking.  Have headaches that keep coming back (recurring).  Feel dizzy.  Have swelling in your ankles.  Have trouble with your vision. Get help right away if you:  Develop a severe headache or confusion.  Have unusual weakness or numbness.  Feel faint.  Have severe pain in your chest or abdomen.  Vomit repeatedly.  Have trouble breathing. Summary  Hypertension is when the force of blood pumping through your arteries is too strong. If this condition is not controlled, it may put you at risk for serious complications.  Your personal target blood pressure may vary depending on your medical conditions, your age, and other factors. For most people, a normal blood pressure is less than 120/80.  Hypertension is treated with lifestyle changes, medicines, or a combination of both. Lifestyle changes include losing weight, eating  a healthy, low-sodium diet, exercising more, and limiting alcohol. This information is not intended to replace advice given to you by your health care provider. Make sure you discuss any questions you have with your health care provider. Document Released: 06/25/2005 Document Revised: 03/05/2018 Document Reviewed: 03/05/2018 Elsevier Patient Education  2020 Elsevier Inc.      Agustina Caroli, MD [facility]

## 2019-02-18 ENCOUNTER — Other Ambulatory Visit: Payer: Self-pay | Admitting: Emergency Medicine

## 2019-02-18 ENCOUNTER — Encounter: Payer: Self-pay | Admitting: Emergency Medicine

## 2019-02-18 LAB — CBC WITH DIFFERENTIAL/PLATELET
Basophils Absolute: 0.1 10*3/uL (ref 0.0–0.2)
Basos: 1 %
EOS (ABSOLUTE): 0.2 10*3/uL (ref 0.0–0.4)
Eos: 2 %
Hematocrit: 47.5 % (ref 37.5–51.0)
Hemoglobin: 15.8 g/dL (ref 13.0–17.7)
Immature Grans (Abs): 0 10*3/uL (ref 0.0–0.1)
Immature Granulocytes: 0 %
Lymphocytes Absolute: 2.8 10*3/uL (ref 0.7–3.1)
Lymphs: 37 %
MCH: 30.3 pg (ref 26.6–33.0)
MCHC: 33.3 g/dL (ref 31.5–35.7)
MCV: 91 fL (ref 79–97)
Monocytes Absolute: 0.6 10*3/uL (ref 0.1–0.9)
Monocytes: 8 %
Neutrophils Absolute: 4 10*3/uL (ref 1.4–7.0)
Neutrophils: 52 %
Platelets: 293 10*3/uL (ref 150–450)
RBC: 5.21 x10E6/uL (ref 4.14–5.80)
RDW: 13.7 % (ref 11.6–15.4)
WBC: 7.7 10*3/uL (ref 3.4–10.8)

## 2019-02-18 LAB — LIPID PANEL
Chol/HDL Ratio: 6 ratio — ABNORMAL HIGH (ref 0.0–5.0)
Cholesterol, Total: 181 mg/dL (ref 100–199)
HDL: 30 mg/dL — ABNORMAL LOW (ref >39)
LDL Calculated: 90 mg/dL (ref 0–99)
Triglycerides: 303 mg/dL — ABNORMAL HIGH (ref 0–149)
VLDL Cholesterol Cal: 61 mg/dL — ABNORMAL HIGH (ref 5–40)

## 2019-02-18 LAB — COMPREHENSIVE METABOLIC PANEL
ALT: 15 IU/L (ref 0–44)
AST: 12 IU/L (ref 0–40)
Albumin/Globulin Ratio: 1.1 — ABNORMAL LOW (ref 1.2–2.2)
Albumin: 3.9 g/dL — ABNORMAL LOW (ref 4.0–5.0)
Alkaline Phosphatase: 70 IU/L (ref 39–117)
BUN/Creatinine Ratio: 10 (ref 9–20)
BUN: 13 mg/dL (ref 6–20)
Bilirubin Total: 0.2 mg/dL (ref 0.0–1.2)
CO2: 17 mmol/L — ABNORMAL LOW (ref 20–29)
Calcium: 9.2 mg/dL (ref 8.7–10.2)
Chloride: 102 mmol/L (ref 96–106)
Creatinine, Ser: 1.29 mg/dL — ABNORMAL HIGH (ref 0.76–1.27)
GFR calc Af Amer: 80 mL/min/{1.73_m2} (ref >59)
GFR calc non Af Amer: 69 mL/min/{1.73_m2} (ref >59)
Globulin, Total: 3.4 g/dL (ref 1.5–4.5)
Glucose: 319 mg/dL — ABNORMAL HIGH (ref 65–99)
Potassium: 4.2 mmol/L (ref 3.5–5.2)
Sodium: 134 mmol/L (ref 134–144)
Total Protein: 7.3 g/dL (ref 6.0–8.5)

## 2019-02-18 LAB — HEMOGLOBIN A1C
Est. average glucose Bld gHb Est-mCnc: 298 mg/dL
Hgb A1c MFr Bld: 12 % — ABNORMAL HIGH (ref 4.8–5.6)

## 2019-02-18 MED ORDER — SITAGLIPTIN PHOSPHATE 50 MG PO TABS: 50.0000 mg | ORAL_TABLET | Freq: Every day | ORAL | 1 refills | Status: DC

## 2019-02-21 ENCOUNTER — Encounter: Payer: Self-pay | Admitting: Emergency Medicine

## 2019-02-25 ENCOUNTER — Other Ambulatory Visit: Payer: Self-pay | Admitting: Emergency Medicine

## 2019-02-25 DIAGNOSIS — E1165 Type 2 diabetes mellitus with hyperglycemia: Secondary | ICD-10-CM

## 2019-02-25 MED ORDER — TRESIBA 100 UNIT/ML ~~LOC~~ SOLN: 10.0000 [IU] | Freq: Every day | SUBCUTANEOUS | 3 refills | Status: AC

## 2019-02-25 NOTE — Telephone Encounter (Signed)
Insulin treatment.  [name] is legally blind so he needs his caretaker to give it to him.  I do not want him on metformin and all other diabetic medications are too expensive.  He is already taking glipizide which is not expensive.  Easiest approach is to add Tresiba 10 units daily at any time.  I do not know how expensive this is but it is insulin.  We can try that.  Thanks.

## 2019-03-01 ENCOUNTER — Encounter: Payer: Self-pay | Admitting: Emergency Medicine

## 2019-03-15 ENCOUNTER — Other Ambulatory Visit: Payer: Self-pay | Admitting: Emergency Medicine

## 2019-03-15 DIAGNOSIS — G932 Benign intracranial hypertension: Secondary | ICD-10-CM

## 2019-03-17 NOTE — Telephone Encounter (Signed)
Requested medication (s) are due for refill today: yes  Requested medication (s) are on the active medication list: yes  Last refill:02/10/2019  Future visit scheduled: yes  Notes to clinic:  Review for refill  Requested Prescriptions  Pending Prescriptions Disp Refills   acetaZOLAMIDE (DIAMOX) 250 MG tablet [Pharmacy Med Name: acetaZOLAMIDE 250 MG Oral Tablet] 120 tablet 0    Sig: TAKE 2 TABLETS BY MOUTH TWICE DAILY . APPOINTMENT REQUIRED FOR FUTURE REFILLS     Ophthalmology: Glaucoma - acetazolamide Passed - 03/15/2019  4:23 PM      Passed - HCT in normal range and within 360 days    Hematocrit  Date Value Ref Range Status  02/17/2019 47.5 37.5 - 51.0 % Final         Passed - HGB in normal range and within 360 days    Hemoglobin  Date Value Ref Range Status  02/17/2019 15.8 13.0 - 17.7 g/dL Final         Passed - K in normal range and within 360 days    Potassium  Date Value Ref Range Status  02/17/2019 4.2 3.5 - 5.2 mmol/L Final         Passed - Na in normal range and within 360 days    Sodium  Date Value Ref Range Status  02/17/2019 134 134 - 144 mmol/L Final         Passed - PLT in normal range and within 360 days    Platelets  Date Value Ref Range Status  02/17/2019 293 150 - 450 x10E3/uL Final         Passed - WBC in normal range and within 360 days    WBC  Date Value Ref Range Status  02/17/2019 7.7 3.4 - 10.8 x10E3/uL Final  12/17/2017 10.3 4.0 - 10.5 K/uL Final         Passed - Valid encounter within last 12 months    Recent Outpatient Visits          4 weeks ago Uncontrolled hypertension   Primary Care at [facility], Ines Bloomer, MD   1 year ago Ceruminosis, bilateral   Primary Care at [place], [place], MD   1 year ago Intractable vomiting with nausea, unspecified vomiting type   Primary Care at [facility], [place], MD   1 year ago Type 2 diabetes mellitus with hyperglycemia, without long-term current use of insulin [facility])    Primary Care at [facility], Ines Bloomer, MD   1 year ago Essential hypertension, malignant   Primary Care at [facility], Ines Bloomer, MD      Future Appointments            In 2 months [place], Ines Bloomer, MD Primary Care at [place], [facility]

## 2019-04-30 ENCOUNTER — Encounter: Payer: Self-pay | Admitting: Emergency Medicine

## 2019-05-13 ENCOUNTER — Other Ambulatory Visit: Payer: Self-pay | Admitting: Emergency Medicine

## 2019-05-13 DIAGNOSIS — Z8639 Personal history of other endocrine, nutritional and metabolic disease: Secondary | ICD-10-CM

## 2019-05-13 DIAGNOSIS — G932 Benign intracranial hypertension: Secondary | ICD-10-CM

## 2019-05-20 ENCOUNTER — Encounter: Payer: Self-pay | Admitting: Emergency Medicine

## 2019-05-20 ENCOUNTER — Ambulatory Visit: Payer: Medicaid Other | Admitting: Emergency Medicine

## 2019-05-20 ENCOUNTER — Other Ambulatory Visit: Payer: Self-pay

## 2019-05-20 VITALS — BP 167/109 | HR 69 | Temp 98.4°F | Resp 16 | Ht 69.25 in | Wt 283.6 lb

## 2019-05-20 DIAGNOSIS — E1169 Type 2 diabetes mellitus with other specified complication: Secondary | ICD-10-CM | POA: Diagnosis not present

## 2019-05-20 DIAGNOSIS — G932 Benign intracranial hypertension: Secondary | ICD-10-CM | POA: Diagnosis not present

## 2019-05-20 DIAGNOSIS — E1159 Type 2 diabetes mellitus with other circulatory complications: Secondary | ICD-10-CM | POA: Diagnosis not present

## 2019-05-20 DIAGNOSIS — I10 Essential (primary) hypertension: Secondary | ICD-10-CM | POA: Diagnosis not present

## 2019-05-20 DIAGNOSIS — E785 Hyperlipidemia, unspecified: Secondary | ICD-10-CM

## 2019-05-20 LAB — POCT GLYCOSYLATED HEMOGLOBIN (HGB A1C): Hemoglobin A1C: 9.2 % — AB (ref 4.0–5.6)

## 2019-05-20 LAB — GLUCOSE, POCT (MANUAL RESULT ENTRY): POC Glucose: 150 mg/dl — AB (ref 70–99)

## 2019-05-20 MED ORDER — ROSUVASTATIN CALCIUM 20 MG PO TABS: 20.0000 mg | ORAL_TABLET | Freq: Every day | ORAL | 3 refills | Status: AC

## 2019-05-20 MED ORDER — ACETAZOLAMIDE 250 MG PO TABS: 500.0000 mg | ORAL_TABLET | Freq: Two times a day (BID) | ORAL | 1 refills | Status: AC

## 2019-05-20 MED ORDER — SITAGLIPTIN PHOSPHATE 50 MG PO TABS: 50.0000 mg | ORAL_TABLET | Freq: Every day | ORAL | 1 refills | Status: DC

## 2019-05-20 MED ORDER — TRULICITY 0.75 MG/0.5ML ~~LOC~~ SOAJ: 0.7500 mg | SUBCUTANEOUS | 3 refills | Status: DC

## 2019-05-20 MED ORDER — LOSARTAN POTASSIUM 100 MG PO TABS: 100.0000 mg | ORAL_TABLET | Freq: Every day | ORAL | 3 refills | Status: DC

## 2019-05-20 NOTE — Patient Instructions (Addendum)
   If you have lab work done today you will be contacted with your lab results within the next 2 weeks.  If you have not heard from us then please contact us. The fastest way to get your results is to register for My Chart.   IF you received an x-ray today, you will receive an invoice from [facility]. Please contact [facility] at [phone] with questions or concerns regarding your invoice.   IF you received labwork today, you will receive an invoice from [facility]. Please contact [facility] at [phone] with questions or concerns regarding your invoice.   Our billing staff will not be able to assist you with questions regarding bills from these companies.  You will be contacted with the lab results as soon as they are available. The fastest way to get your results is to activate your My Chart account. Instructions are located on the last page of this paperwork. If you have not heard from us regarding the results in 2 weeks, please contact this office.     Diabetes Mellitus and Nutrition, Adult When you have diabetes (diabetes mellitus), it is very important to have healthy eating habits because your blood sugar (glucose) levels are greatly affected by what you eat and drink. Eating healthy foods in the appropriate amounts, at about the same times every day, can help you:  Control your blood glucose.  Lower your risk of heart disease.  Improve your blood pressure.  Reach or maintain a healthy weight. Every person with diabetes is different, and each person has different needs for a meal plan. Your health care provider may recommend that you work with a diet and nutrition specialist (dietitian) to make a meal plan that is best for you. Your meal plan may vary depending on factors such as:  The calories you need.  The medicines you take.  Your weight.  Your blood glucose, blood pressure, and cholesterol levels.  Your activity level.  Other health conditions  you have, such as heart or kidney disease. How do carbohydrates affect me? Carbohydrates, also called carbs, affect your blood glucose level more than any other type of food. Eating carbs naturally raises the amount of glucose in your blood. Carb counting is a method for keeping track of how many carbs you eat. Counting carbs is important to keep your blood glucose at a healthy level, especially if you use insulin or take certain oral diabetes medicines. It is important to know how many carbs you can safely have in each meal. This is different for every person. Your dietitian can help you calculate how many carbs you should have at each meal and for each snack. Foods that contain carbs include:  Bread, cereal, rice, pasta, and crackers.  Potatoes and corn.  Peas, beans, and lentils.  Milk and yogurt.  Fruit and juice.  Desserts, such as cakes, cookies, ice cream, and candy. How does alcohol affect me? Alcohol can cause a sudden decrease in blood glucose (hypoglycemia), especially if you use insulin or take certain oral diabetes medicines. Hypoglycemia can be a life-threatening condition. Symptoms of hypoglycemia (sleepiness, dizziness, and confusion) are similar to symptoms of having too much alcohol. If your health care provider says that alcohol is safe for you, follow these guidelines:  Limit alcohol intake to no more than 1 drink per day for nonpregnant women and 2 drinks per day for men. One drink equals 12 oz of beer, 5 oz of wine, or 1 oz of hard liquor.    Do not drink on an empty stomach.  Keep yourself hydrated with water, diet soda, or unsweetened iced tea.  Keep in mind that regular soda, juice, and other mixers may contain a lot of sugar and must be counted as carbs. What are tips for following this plan?  Reading food labels  Start by checking the serving size on the "Nutrition Facts" label of packaged foods and drinks. The amount of calories, carbs, fats, and other  nutrients listed on the label is based on one serving of the item. Many items contain more than one serving per package.  Check the total grams (g) of carbs in one serving. You can calculate the number of servings of carbs in one serving by dividing the total carbs by 15. For example, if a food has 30 g of total carbs, it would be equal to 2 servings of carbs.  Check the number of grams (g) of saturated and trans fats in one serving. Choose foods that have low or no amount of these fats.  Check the number of milligrams (mg) of salt (sodium) in one serving. Most people should limit total sodium intake to less than 2,300 mg per day.  Always check the nutrition information of foods labeled as "low-fat" or "nonfat". These foods may be higher in added sugar or refined carbs and should be avoided.  Talk to your dietitian to identify your daily goals for nutrients listed on the label. Shopping  Avoid buying canned, premade, or processed foods. These foods tend to be high in fat, sodium, and added sugar.  Shop around the outside edge of the grocery store. This includes fresh fruits and vegetables, bulk grains, fresh meats, and fresh dairy. Cooking  Use low-heat cooking methods, such as baking, instead of high-heat cooking methods like deep frying.  Cook using healthy oils, such as olive, canola, or sunflower oil.  Avoid cooking with butter, cream, or high-fat meats. Meal planning  Eat meals and snacks regularly, preferably at the same times every day. Avoid going long periods of time without eating.  Eat foods high in fiber, such as fresh fruits, vegetables, beans, and whole grains. Talk to your dietitian about how many servings of carbs you can eat at each meal.  Eat 4-6 ounces (oz) of lean protein each day, such as lean meat, chicken, fish, eggs, or tofu. One oz of lean protein is equal to: ? 1 oz of meat, chicken, or fish. ? 1 egg. ?  cup of tofu.  Eat some foods each day that contain  healthy fats, such as avocado, nuts, seeds, and fish. Lifestyle  Check your blood glucose regularly.  Exercise regularly as told by your health care provider. This may include: ? 150 minutes of moderate-intensity or vigorous-intensity exercise each week. This could be brisk walking, biking, or water aerobics. ? Stretching and doing strength exercises, such as yoga or weightlifting, at least 2 times a week.  Take medicines as told by your health care provider.  Do not use any products that contain nicotine or tobacco, such as cigarettes and e-cigarettes. If you need help quitting, ask your health care provider.  Work with a counselor or diabetes educator to identify strategies to manage stress and any emotional and social challenges. Questions to ask a health care provider  Do I need to meet with a diabetes educator?  Do I need to meet with a dietitian?  What number can I call if I have questions?  When are the best times to   check my blood glucose? Where to find more information:  American Diabetes Association: diabetes.org  Academy of Nutrition and Dietetics: www.eatright.org  National Institute of Diabetes and Digestive and Kidney Diseases (NIH): www.niddk.nih.gov Summary  A healthy meal plan will help you control your blood glucose and maintain a healthy lifestyle.  Working with a diet and nutrition specialist (dietitian) can help you make a meal plan that is best for you.  Keep in mind that carbohydrates (carbs) and alcohol have immediate effects on your blood glucose levels. It is important to count carbs and to use alcohol carefully. This information is not intended to replace advice given to you by your health care provider. Make sure you discuss any questions you have with your health care provider. Document Released: 03/22/2005 Document Revised: 06/07/2017 Document Reviewed: 07/30/2016 Elsevier Patient Education  2020 [employer].  Hypertension, Adult High blood  pressure (hypertension) is when the force of blood pumping through the arteries is too strong. The arteries are the blood vessels that carry blood from the heart throughout the body. Hypertension forces the heart to work harder to pump blood and may cause arteries to become narrow or stiff. Untreated or uncontrolled hypertension can cause a heart attack, heart failure, a stroke, kidney disease, and other problems. A blood pressure reading consists of a higher number over a lower number. Ideally, your blood pressure should be below 120/80. The first ("top") number is called the systolic pressure. It is a measure of the pressure in your arteries as your heart beats. The second ("bottom") number is called the diastolic pressure. It is a measure of the pressure in your arteries as the heart relaxes. What are the causes? The exact cause of this condition is not known. There are some conditions that result in or are related to high blood pressure. What increases the risk? Some risk factors for high blood pressure are under your control. The following factors may make you more likely to develop this condition:  Smoking.  Having type 2 diabetes mellitus, high cholesterol, or both.  Not getting enough exercise or physical activity.  Being overweight.  Having too much fat, sugar, calories, or salt (sodium) in your diet.  Drinking too much alcohol. Some risk factors for high blood pressure may be difficult or impossible to change. Some of these factors include:  Having chronic kidney disease.  Having a family history of high blood pressure.  Age. Risk increases with age.  Race. You may be at higher risk if you are African American.  Gender. Men are at higher risk than women before age 45. After age 65, women are at higher risk than men.  Having obstructive sleep apnea.  Stress. What are the signs or symptoms? High blood pressure may not cause symptoms. Very high blood pressure (hypertensive  crisis) may cause:  Headache.  Anxiety.  Shortness of breath.  Nosebleed.  Nausea and vomiting.  Vision changes.  Severe chest pain.  Seizures. How is this diagnosed? This condition is diagnosed by measuring your blood pressure while you are seated, with your arm resting on a flat surface, your legs uncrossed, and your feet flat on the floor. The cuff of the blood pressure monitor will be placed directly against the skin of your upper arm at the level of your heart. It should be measured at least twice using the same arm. Certain conditions can cause a difference in blood pressure between your right and left arms. Certain factors can cause blood pressure readings to be   lower or higher than normal for a short period of time:  When your blood pressure is higher when you are in a health care provider's office than when you are at home, this is called white coat hypertension. Most people with this condition do not need medicines.  When your blood pressure is higher at home than when you are in a health care provider's office, this is called masked hypertension. Most people with this condition may need medicines to control blood pressure. If you have a high blood pressure reading during one visit or you have normal blood pressure with other risk factors, you may be asked to:  Return on a different day to have your blood pressure checked again.  Monitor your blood pressure at home for 1 week or longer. If you are diagnosed with hypertension, you may have other blood or imaging tests to help your health care provider understand your overall risk for other conditions. How is this treated? This condition is treated by making healthy lifestyle changes, such as eating healthy foods, exercising more, and reducing your alcohol intake. Your health care provider may prescribe medicine if lifestyle changes are not enough to get your blood pressure under control, and if:  Your systolic blood pressure  is above 130.  Your diastolic blood pressure is above 80. Your personal target blood pressure may vary depending on your medical conditions, your age, and other factors. Follow these instructions at home: Eating and drinking   Eat a diet that is high in fiber and potassium, and low in sodium, added sugar, and fat. An example eating plan is called the DASH (Dietary Approaches to Stop Hypertension) diet. To eat this way: ? Eat plenty of fresh fruits and vegetables. Try to fill one half of your plate at each meal with fruits and vegetables. ? Eat whole grains, such as whole-wheat pasta, brown rice, or whole-grain bread. Fill about one fourth of your plate with whole grains. ? Eat or drink low-fat dairy products, such as skim milk or low-fat yogurt. ? Avoid fatty cuts of meat, processed or cured meats, and poultry with skin. Fill about one fourth of your plate with lean proteins, such as fish, chicken without skin, beans, eggs, or tofu. ? Avoid pre-made and processed foods. These tend to be higher in sodium, added sugar, and fat.  Reduce your daily sodium intake. Most people with hypertension should eat less than 1,500 mg of sodium a day.  Do not drink alcohol if: ? Your health care provider tells you not to drink. ? You are pregnant, may be pregnant, or are planning to become pregnant.  If you drink alcohol: ? Limit how much you use to:  0-1 drink a day for women.  0-2 drinks a day for men. ? Be aware of how much alcohol is in your drink. In the [place] one drink equals one 12 oz bottle of beer (355 mL), one 5 oz glass of wine (148 mL), or one 1 oz glass of hard liquor (44 mL). Lifestyle   Work with your health care provider to maintain a healthy body weight or to lose weight. Ask what an ideal weight is for you.  Get at least 30 minutes of exercise most days of the week. Activities may include walking, swimming, or biking.  Include exercise to strengthen your muscles (resistance  exercise), such as Pilates or lifting weights, as part of your weekly exercise routine. Try to do these types of exercises for 30 minutes at least   3 days a week.  Do not use any products that contain nicotine or tobacco, such as cigarettes, e-cigarettes, and chewing tobacco. If you need help quitting, ask your health care provider.  Monitor your blood pressure at home as told by your health care provider.  Keep all follow-up visits as told by your health care provider. This is important. Medicines  Take over-the-counter and prescription medicines only as told by your health care provider. Follow directions carefully. Blood pressure medicines must be taken as prescribed.  Do not skip doses of blood pressure medicine. Doing this puts you at risk for problems and can make the medicine less effective.  Ask your health care provider about side effects or reactions to medicines that you should watch for. Contact a health care provider if you:  Think you are having a reaction to a medicine you are taking.  Have headaches that keep coming back (recurring).  Feel dizzy.  Have swelling in your ankles.  Have trouble with your vision. Get help right away if you:  Develop a severe headache or confusion.  Have unusual weakness or numbness.  Feel faint.  Have severe pain in your chest or abdomen.  Vomit repeatedly.  Have trouble breathing. Summary  Hypertension is when the force of blood pumping through your arteries is too strong. If this condition is not controlled, it may put you at risk for serious complications.  Your personal target blood pressure may vary depending on your medical conditions, your age, and other factors. For most people, a normal blood pressure is less than 120/80.  Hypertension is treated with lifestyle changes, medicines, or a combination of both. Lifestyle changes include losing weight, eating a healthy, low-sodium diet, exercising more, and limiting  alcohol. This information is not intended to replace advice given to you by your health care provider. Make sure you discuss any questions you have with your health care provider. Document Released: 06/25/2005 Document Revised: 03/05/2018 Document Reviewed: 03/05/2018 Elsevier Patient Education  2020 [employer] Inc.  

## 2019-05-20 NOTE — Assessment & Plan Note (Signed)
Uncontrolled hypertension.  Patient stopped lisinopril 1 week ago due to side effects of leg edema.  Continue amlodipine and carvedilol, start losartan 100 mg daily. Diabetes improved with hemoglobin A1c down to 9.2.  Continue glipizide 5 mg twice a day and Januvia 50 mg daily.  Unable to take metformin.  We will add Trulicity 0.51 mg weekly.  Diet and nutrition discussed with patient.  Follow-up in 3 months.

## 2019-05-20 NOTE — Progress Notes (Signed)
Lab Results  Component Value Date   HGBA1C 12.0 (H) 02/17/2019   BP Readings from Last 3 Encounters:  05/20/19 (!) 167/109  02/17/19 (!) 160/110  01/24/18 (!) 147/95   Lab Results  Component Value Date   CREATININE 1.29 (H) 02/17/2019   BUN 13 02/17/2019   NA 134 02/17/2019   K 4.2 02/17/2019   CL 102 02/17/2019   CO2 17 (L) 02/17/2019   William Zuniga 38 y.o.   Chief Complaint  Patient presents with  . Diabetes    follow up 3 month  . Hypertension    HISTORY OF PRESENT ILLNESS: This is a 38 y.o. male with history of diabetes and hypertension here for follow-up. 1.  Hypertension presently taking amlodipine 10 mg daily, carvedilol 12.5 mg twice a day.  Stopped lisinopril last week due to lower extremity swelling.  Swelling improved after stopping medication. 2.  Diabetes: Taking glipizide 5 mg twice a day and Januvia 50 mg once a day.  Caretaker states his glucose at home has been much better than before, 120-150. 3.  Idiopathic intracranial hypertension, legally blind, taking Diamox 500 mg twice a day.  HPI   Prior to Admission medications   Medication Sig Start Date End Date Taking? Authorizing Provider  acetaZOLAMIDE (DIAMOX) 250 MG tablet TAKE 2 TABLETS BY MOUTH TWICE DAILY . APPOINTMENT REQUIRED FOR FUTURE REFILLS 03/17/19  Yes Jackie Littlejohn, Ines Bloomer, MD  omeprazole (PRILOSEC) 40 MG capsule Take 1 capsule (40 mg total) by mouth daily. 12/18/17  Yes Robbye Dede, Ines Bloomer, MD  acetaminophen (TYLENOL) 500 MG tablet Take 2 tablets (1,000 mg total) by mouth every 6 (six) hours as needed for mild pain, moderate pain or fever. Patient not taking: Reported on 05/20/2019 07/30/17   Collier Salina, MD  acetaZOLAMIDE (DIAMOX) 500 MG capsule Take 500 mg by mouth 2 (two) times daily.    [provider]  amLODipine (NORVASC) 10 MG tablet Take 1 tablet (10 mg total) by mouth daily. MUST BE SEEN IN OFFICE FOR ADDITIONAL REFILLS 02/10/19 03/12/19  Horald Pollen, MD   blood glucose meter kit and supplies KIT Dispense based on patient and insurance preference. Use up to four times daily as directed. (FOR ICD-9 250.00, 250.01). 02/10/19   Horald Pollen, MD  carvedilol (COREG) 12.5 MG tablet Take 1 tablet (12.5 mg total) by mouth 2 (two) times daily with a meal. 02/10/19 03/12/19  Jaxon Flatt, Ines Bloomer, MD  glipiZIDE (GLUCOTROL) 5 MG tablet Take 1 tablet (5 mg total) by mouth 2 (two) times daily before a meal. 02/10/19 03/12/19  Sarahbeth Cashin, Ines Bloomer, MD  Insulin Degludec (TRESIBA) 100 UNIT/ML SOLN Inject 10 Units into the skin daily. Patient not taking: Reported on 05/20/2019 02/25/19   Horald Pollen, MD  labetalol (NORMODYNE,TRANDATE) 5 MG/ML injection Inject 2 mLs (10 mg total) into the vein every 6 (six) hours as needed (SBP > 160). Patient not taking: Reported on 05/20/2019 07/30/17   Collier Salina, MD  ondansetron (ZOFRAN) 4 MG tablet Take 1 tablet (4 mg total) by mouth every 8 (eight) hours as needed for nausea or vomiting. Patient not taking: Reported on 05/20/2019 12/18/17   Horald Pollen, MD  potassium chloride (K-DUR) 10 MEQ tablet Take 1 tablet (10 mEq total) by mouth 2 (two) times daily. MUST BE SEEN IN OFFICE FOR ADDITIONAL REFILLS 02/10/19 03/12/19  Horald Pollen, MD  sitaGLIPtin (JANUVIA) 50 MG tablet Take 1 tablet (50 mg total) by mouth daily. 05/20/19 08/18/19  Horald Pollen, MD  acetaZOLAMIDE (DIAMOX) 250 MG tablet Take 2 tablets (500 mg total) by mouth 2 (two) times daily. 02/10/19   Horald Pollen, MD    Allergies  Allergen Reactions  . Lisinopril Swelling    legs  . Shellfish Allergy     Patient Active Problem List   Diagnosis Date Noted  . GERD with esophagitis 12/18/2017  . Legally blind 12/17/2017  . Type 2 diabetes mellitus with hyperglycemia, without long-term current use of insulin ([place]) 11/29/2017  . Essential hypertension, malignant 08/30/2017  . Idiopathic intracranial hypertension 08/30/2017   . History of hypokalemia 08/30/2017  . Uncontrolled hypertension 07/26/2017  . 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 Diabetes mellitus without complication ([place])   . Hypertension   . IIH (idiopathic intracranial hypertension)     Past Surgical History:  Procedure Laterality Date  . EYE SURGERY      Social History   Socioeconomic History  . Marital status: Single    Spouse name: Not on file  . Number of children: 7  . Years of education: Not on file  . Highest education level: Not on file  Occupational History  . Not on file  Social Needs  . Financial resource strain: Not on file  . Food insecurity    Worry: Not on file    Inability: Not on file  . Transportation needs    Medical: Not on file    Non-medical: Not on file  Tobacco Use  . Smoking status: Never Smoker  . Smokeless tobacco: Never Used  Substance and Sexual Activity  . Alcohol use: No    Frequency: Never  . Drug use: Yes    Types: Marijuana  . Sexual activity: Yes  Lifestyle  . Physical activity    Days per week: Not on file    Minutes per session: Not on file  . Stress: Not on file  Relationships  . Social Herbalist on phone: Not on file    Gets together: Not on file    Attends religious service: Not on file    Active member of club or organization: Not on file    Attends meetings of clubs or organizations: Not on file    Relationship status: Not on file  . Intimate partner violence    Fear of current or ex partner: Not on file    Emotionally abused: Not on file    Physically abused: Not on file    Forced sexual activity: Not on file  Other Topics Concern  . Not on file  Social History Narrative  . Not on file    History reviewed. No pertinent family history.   Review of Systems  Constitutional: Negative.  Negative for chills and fever.  HENT: Negative.  Negative for  congestion and sore throat.   Eyes:       Legally blind  Respiratory: Negative.  Negative for cough and shortness of breath.   Cardiovascular: Negative.  Negative for chest pain and palpitations.  Gastrointestinal: Negative.  Negative for abdominal pain, diarrhea, nausea and vomiting.  Genitourinary: Negative for hematuria.  Musculoskeletal: Negative for myalgias.  Skin: Negative for rash.  Neurological: Negative for dizziness and headaches.  All other systems reviewed and are negative.  Vitals:   05/20/19 1518  BP: (!) 167/109  Pulse: 69  Resp: 16  Temp: 98.4 F (36.9 C)  SpO2: 98%     Physical Exam Vitals signs reviewed.  Constitutional:      Appearance: Normal appearance.  HENT:     Head: Normocephalic.  Neck:     Musculoskeletal: Normal range of motion.  Cardiovascular:     Rate and Rhythm: Normal rate and regular rhythm.     Heart sounds: Normal heart sounds.  Pulmonary:     Effort: Pulmonary effort is normal.     Breath sounds: Normal breath sounds.  Musculoskeletal: Normal range of motion.  Skin:    General: Skin is warm and dry.     Capillary Refill: Capillary refill takes less than 2 seconds.  Neurological:     General: No focal deficit present.     Mental Status: He is alert and oriented to person, place, and time.  Psychiatric:        Mood and Affect: Mood normal.        Behavior: Behavior normal.    Results for orders placed or performed in visit on 05/20/19 (from the past 24 hour(s))  POCT glucose (manual entry)     Status: Abnormal   Collection Time: 05/20/19  4:23 PM  Result Value Ref Range   POC Glucose 150 (A) 70 - 99 mg/dl  POCT glycosylated hemoglobin (Hb A1C)     Status: Abnormal   Collection Time: 05/20/19  4:29 PM  Result Value Ref Range   Hemoglobin A1C 9.2 (A) 4.0 - 5.6 %   HbA1c POC (<> result, manual entry)     HbA1c, POC (prediabetic range)     HbA1c, POC (controlled diabetic range)     A total of 45 minutes was spent in the  room with the patient, greater than 50% of which was in counseling/coordination of care regarding diabetes and hypertension, cardiovascular risks associated with these, medication changes, doses, and side effects, hypoglycemia precautions, need for cholesterol medications, review of most recent lab work, diet and nutrition as well as physical activity, lifestyle modifications, prognosis and need for follow-up in 3 months.   ASSESSMENT & PLAN: Hypertension associated with diabetes ([place]) Uncontrolled hypertension.  Patient stopped lisinopril 1 week ago due to side effects of leg edema.  Continue amlodipine and carvedilol, start losartan 100 mg daily. Diabetes improved with hemoglobin A1c down to 9.2.  Continue glipizide 5 mg twice a day and Januvia 50 mg daily.  Unable to take metformin.  We will add Trulicity 4.40 mg weekly.  Diet and nutrition discussed with patient.  Follow-up in 3 months.  [place] was seen today for diabetes and hypertension.  Diagnoses and all orders for this visit:  Hypertension associated with diabetes ([place]) -     sitaGLIPtin (JANUVIA) 50 MG tablet; Take 1 tablet (50 mg total) by mouth daily. -     POCT glucose (manual entry) -     POCT glycosylated hemoglobin (Hb A1C) -     losartan (COZAAR) 100 MG tablet; Take 1 tablet (100 mg total) by mouth daily. -     Dulaglutide (TRULICITY) 3.47 QQ/5.9DG SOPN; Inject 0.75 mg into the skin once a week.  Idiopathic intracranial hypertension -     acetaZOLAMIDE (DIAMOX) 250 MG tablet; Take 2 tablets (500 mg total) by mouth 2 (two) times daily.  Dyslipidemia associated with type 2 diabetes mellitus ([place])  Other orders -     rosuvastatin (CRESTOR) 20 MG tablet; Take 1 tablet (20 mg total) by mouth daily.    Patient Instructions       If you  have lab work done today you will be contacted with your lab results within the next 2 weeks.  If you have not heard from [place] then please contact us. The fastest way to get your results is  to register for My Chart.   IF you received an x-ray today, you will receive an invoice from [facility]. Please contact [facility] at [phone] with questions or concerns regarding your invoice.   IF you received labwork today, you will receive an invoice from [place]. Please contact [facility] at [phone] with questions or concerns regarding your invoice.   Our billing staff will not be able to assist you with questions regarding bills from these companies.  You will be contacted with the lab results as soon as they are available. The fastest way to get your results is to activate your My Chart account. Instructions are located on the last page of this paperwork. If you have not heard from [place] regarding the results in 2 weeks, please contact this office.     Diabetes Mellitus and Nutrition, Adult When you have diabetes (diabetes mellitus), it is very important to have healthy eating habits because your blood sugar (glucose) levels are greatly affected by what you eat and drink. Eating healthy foods in the appropriate amounts, at about the same times every day, can help you:  Control your blood glucose.  Lower your risk of heart disease.  Improve your blood pressure.  Reach or maintain a healthy weight. Every person with diabetes is different, and each person has different needs for a meal plan. Your health care provider may recommend that you work with a diet and nutrition specialist (dietitian) to make a meal plan that is best for you. Your meal plan may vary depending on factors such as:  The calories you need.  The medicines you take.  Your weight.  Your blood glucose, blood pressure, and cholesterol levels.  Your activity level.  Other health conditions you have, such as heart or kidney disease. How do carbohydrates affect me? Carbohydrates, also called carbs, affect your blood glucose level more than any other type of food. Eating carbs naturally  raises the amount of glucose in your blood. Carb counting is a method for keeping track of how many carbs you eat. Counting carbs is important to keep your blood glucose at a healthy level, especially if you use insulin or take certain oral diabetes medicines. It is important to know how many carbs you can safely have in each meal. This is different for every person. Your dietitian can help you calculate how many carbs you should have at each meal and for each snack. Foods that contain carbs include:  Bread, cereal, rice, pasta, and crackers.  Potatoes and corn.  Peas, beans, and lentils.  Milk and yogurt.  Fruit and juice.  Desserts, such as cakes, cookies, ice cream, and candy. How does alcohol affect me? Alcohol can cause a sudden decrease in blood glucose (hypoglycemia), especially if you use insulin or take certain oral diabetes medicines. Hypoglycemia can be a life-threatening condition. Symptoms of hypoglycemia (sleepiness, dizziness, and confusion) are similar to symptoms of having too much alcohol. If your health care provider says that alcohol is safe for you, follow these guidelines:  Limit alcohol intake to no more than 1 drink per day for nonpregnant women and 2 drinks per day for men. One drink equals 12 oz of beer, 5 oz of wine, or 1 oz of hard liquor.  Do not drink  on an empty stomach.  Keep yourself hydrated with water, diet soda, or unsweetened iced tea.  Keep in mind that regular soda, juice, and other mixers may contain a lot of sugar and must be counted as carbs. What are tips for following this plan?  Reading food labels  Start by checking the serving size on the "Nutrition Facts" label of packaged foods and drinks. The amount of calories, carbs, fats, and other nutrients listed on the label is based on one serving of the item. Many items contain more than one serving per package.  Check the total grams (g) of carbs in one serving. You can calculate the number  of servings of carbs in one serving by dividing the total carbs by 15. For example, if a food has 30 g of total carbs, it would be equal to 2 servings of carbs.  Check the number of grams (g) of saturated and trans fats in one serving. Choose foods that have low or no amount of these fats.  Check the number of milligrams (mg) of salt (sodium) in one serving. Most people should limit total sodium intake to less than 2,300 mg per day.  Always check the nutrition information of foods labeled as "low-fat" or "nonfat". These foods may be higher in added sugar or refined carbs and should be avoided.  Talk to your dietitian to identify your daily goals for nutrients listed on the label. Shopping  Avoid buying canned, premade, or processed foods. These foods tend to be high in fat, sodium, and added sugar.  Shop around the outside edge of the grocery store. This includes fresh fruits and vegetables, bulk grains, fresh meats, and fresh dairy. Cooking  Use low-heat cooking methods, such as baking, instead of high-heat cooking methods like deep frying.  Cook using healthy oils, such as olive, canola, or sunflower oil.  Avoid cooking with butter, cream, or high-fat meats. Meal planning  Eat meals and snacks regularly, preferably at the same times every day. Avoid going long periods of time without eating.  Eat foods high in fiber, such as fresh fruits, vegetables, beans, and whole grains. Talk to your dietitian about how many servings of carbs you can eat at each meal.  Eat 4-6 ounces (oz) of lean protein each day, such as lean meat, chicken, fish, eggs, or tofu. One oz of lean protein is equal to: ? 1 oz of meat, chicken, or fish. ? 1 egg. ?  cup of tofu.  Eat some foods each day that contain healthy fats, such as avocado, nuts, seeds, and fish. Lifestyle  Check your blood glucose regularly.  Exercise regularly as told by your health care provider. This may include: ? 150 minutes of  moderate-intensity or vigorous-intensity exercise each week. This could be brisk walking, biking, or water aerobics. ? Stretching and doing strength exercises, such as yoga or weightlifting, at least 2 times a week.  Take medicines as told by your health care provider.  Do not use any products that contain nicotine or tobacco, such as cigarettes and e-cigarettes. If you need help quitting, ask your health care provider.  Work with a Social worker or diabetes educator to identify strategies to manage stress and any emotional and social challenges. Questions to ask a health care provider  Do I need to meet with a diabetes educator?  Do I need to meet with a dietitian?  What number can I call if I have questions?  When are the best times to check my blood  glucose? Where to find more information:  American Diabetes Association: diabetes.org  Academy of Nutrition and Dietetics: www.eatright.[employer] of Diabetes and Digestive and Kidney Diseases (NIH): [employer] Summary  A healthy meal plan will help you control your blood glucose and maintain a healthy lifestyle.  Working with a diet and nutrition specialist (dietitian) can help you make a meal plan that is best for you.  Keep in mind that carbohydrates (carbs) and alcohol have immediate effects on your blood glucose levels. It is important to count carbs and to use alcohol carefully. This information is not intended to replace advice given to you by your health care provider. Make sure you discuss any questions you have with your health care provider. Document Released: 03/22/2005 Document Revised: 06/07/2017 Document Reviewed: 07/30/2016 Elsevier Patient Education  2020 [employer].  Hypertension, Adult High blood pressure (hypertension) is when the force of blood pumping through the arteries is too strong. The arteries are the blood vessels that carry blood from the heart throughout the body. Hypertension forces  the heart to work harder to pump blood and may cause arteries to become narrow or stiff. Untreated or uncontrolled hypertension can cause a heart attack, heart failure, a stroke, kidney disease, and other problems. A blood pressure reading consists of a higher number over a lower number. Ideally, your blood pressure should be below 120/80. The first ("top") number is called the systolic pressure. It is a measure of the pressure in your arteries as your heart beats. The second ("bottom") number is called the diastolic pressure. It is a measure of the pressure in your arteries as the heart relaxes. What are the causes? The exact cause of this condition is not known. There are some conditions that result in or are related to high blood pressure. What increases the risk? Some risk factors for high blood pressure are under your control. The following factors may make you more likely to develop this condition:  Smoking.  Having type 2 diabetes mellitus, high cholesterol, or both.  Not getting enough exercise or physical activity.  Being overweight.  Having too much fat, sugar, calories, or salt (sodium) in your diet.  Drinking too much alcohol. Some risk factors for high blood pressure may be difficult or impossible to change. Some of these factors include:  Having chronic kidney disease.  Having a family history of high blood pressure.  Age. Risk increases with age.  Race. You may be at higher risk if you are African American.  Gender. Men are at higher risk than women before age 86. After age 41, women are at higher risk than men.  Having obstructive sleep apnea.  Stress. What are the signs or symptoms? High blood pressure may not cause symptoms. Very high blood pressure (hypertensive crisis) may cause:  Headache.  Anxiety.  Shortness of breath.  Nosebleed.  Nausea and vomiting.  Vision changes.  Severe chest pain.  Seizures. How is this diagnosed? This condition is  diagnosed by measuring your blood pressure while you are seated, with your arm resting on a flat surface, your legs uncrossed, and your feet flat on the floor. The cuff of the blood pressure monitor will be placed directly against the skin of your upper arm at the level of your heart. It should be measured at least twice using the same arm. Certain conditions can cause a difference in blood pressure between your right and left arms. Certain factors can cause blood pressure readings to be lower or higher  than normal for a short period of time:  When your blood pressure is higher when you are in a health care provider's office than when you are at home, this is called white coat hypertension. Most people with this condition do not need medicines.  When your blood pressure is higher at home than when you are in a health care provider's office, this is called masked hypertension. Most people with this condition may need medicines to control blood pressure. If you have a high blood pressure reading during one visit or you have normal blood pressure with other risk factors, you may be asked to:  Return on a different day to have your blood pressure checked again.  Monitor your blood pressure at home for 1 week or longer. If you are diagnosed with hypertension, you may have other blood or imaging tests to help your health care provider understand your overall risk for other conditions. How is this treated? This condition is treated by making healthy lifestyle changes, such as eating healthy foods, exercising more, and reducing your alcohol intake. Your health care provider may prescribe medicine if lifestyle changes are not enough to get your blood pressure under control, and if:  Your systolic blood pressure is above 130.  Your diastolic blood pressure is above 80. Your personal target blood pressure may vary depending on your medical conditions, your age, and other factors. Follow these instructions at  home: Eating and drinking   Eat a diet that is high in fiber and potassium, and low in sodium, added sugar, and fat. An example eating plan is called the DASH (Dietary Approaches to Stop Hypertension) diet. To eat this way: ? Eat plenty of fresh fruits and vegetables. Try to fill one half of your plate at each meal with fruits and vegetables. ? Eat whole grains, such as whole-wheat pasta, brown rice, or whole-grain bread. Fill about one fourth of your plate with whole grains. ? Eat or drink low-fat dairy products, such as skim milk or low-fat yogurt. ? Avoid fatty cuts of meat, processed or cured meats, and poultry with skin. Fill about one fourth of your plate with lean proteins, such as fish, chicken without skin, beans, eggs, or tofu. ? Avoid pre-made and processed foods. These tend to be higher in sodium, added sugar, and fat.  Reduce your daily sodium intake. Most people with hypertension should eat less than 1,500 mg of sodium a day.  Do not drink alcohol if: ? Your health care provider tells you not to drink. ? You are pregnant, may be pregnant, or are planning to become pregnant.  If you drink alcohol: ? Limit how much you use to:  0-1 drink a day for women.  0-2 drinks a day for men. ? Be aware of how much alcohol is in your drink. In the [place] one drink equals one 12 oz bottle of beer (355 mL), one 5 oz glass of wine (148 mL), or one 1 oz glass of hard liquor (44 mL). Lifestyle   Work with your health care provider to maintain a healthy body weight or to lose weight. Ask what an ideal weight is for you.  Get at least 30 minutes of exercise most days of the week. Activities may include walking, swimming, or biking.  Include exercise to strengthen your muscles (resistance exercise), such as Pilates or lifting weights, as part of your weekly exercise routine. Try to do these types of exercises for 30 minutes at least 3 days a week.  Do not use any products that contain  nicotine or tobacco, such as cigarettes, e-cigarettes, and chewing tobacco. If you need help quitting, ask your health care provider.  Monitor your blood pressure at home as told by your health care provider.  Keep all follow-up visits as told by your health care provider. This is important. Medicines  Take over-the-counter and prescription medicines only as told by your health care provider. Follow directions carefully. Blood pressure medicines must be taken as prescribed.  Do not skip doses of blood pressure medicine. Doing this puts you at risk for problems and can make the medicine less effective.  Ask your health care provider about side effects or reactions to medicines that you should watch for. Contact a health care provider if you:  Think you are having a reaction to a medicine you are taking.  Have headaches that keep coming back (recurring).  Feel dizzy.  Have swelling in your ankles.  Have trouble with your vision. Get help right away if you:  Develop a severe headache or confusion.  Have unusual weakness or numbness.  Feel faint.  Have severe pain in your chest or abdomen.  Vomit repeatedly.  Have trouble breathing. Summary  Hypertension is when the force of blood pumping through your arteries is too strong. If this condition is not controlled, it may put you at risk for serious complications.  Your personal target blood pressure may vary depending on your medical conditions, your age, and other factors. For most people, a normal blood pressure is less than 120/80.  Hypertension is treated with lifestyle changes, medicines, or a combination of both. Lifestyle changes include losing weight, eating a healthy, low-sodium diet, exercising more, and limiting alcohol. This information is not intended to replace advice given to you by your health care provider. Make sure you discuss any questions you have with your health care provider. Document Released: 06/25/2005  Document Revised: 03/05/2018 Document Reviewed: 03/05/2018 Elsevier Patient Education  2020 Elsevier Inc.      Agustina Caroli, MD [facility]

## 2019-05-25 ENCOUNTER — Encounter: Payer: Self-pay | Admitting: Emergency Medicine

## 2019-05-26 ENCOUNTER — Other Ambulatory Visit: Payer: Self-pay | Admitting: *Deleted

## 2019-05-26 DIAGNOSIS — Z8639 Personal history of other endocrine, nutritional and metabolic disease: Secondary | ICD-10-CM

## 2019-05-26 MED ORDER — POTASSIUM CHLORIDE CRYS ER 10 MEQ PO TBCR: 10.0000 meq | EXTENDED_RELEASE_TABLET | Freq: Two times a day (BID) | ORAL | 0 refills | Status: DC

## 2019-05-28 ENCOUNTER — Other Ambulatory Visit: Payer: Self-pay | Admitting: Emergency Medicine

## 2019-05-28 ENCOUNTER — Telehealth: Payer: Self-pay | Admitting: Emergency Medicine

## 2019-05-28 DIAGNOSIS — E1165 Type 2 diabetes mellitus with hyperglycemia: Secondary | ICD-10-CM

## 2019-05-28 MED ORDER — BYDUREON 2 MG ~~LOC~~ PEN: 2.0000 mg | PEN_INJECTOR | SUBCUTANEOUS | 1 refills | Status: AC

## 2019-05-28 NOTE — Telephone Encounter (Signed)
Will try Bydureon before initiating PA.  Thanks.

## 2019-05-28 NOTE — Telephone Encounter (Signed)
New medication was sent do I need to call pt and advise?

## 2019-05-28 NOTE — Telephone Encounter (Signed)
I called Medicaid and initiated a PA for both medications Trulicity and Januvia. His Januvia is approved for 1 year. However, the Trulicity was denied by [employer]. He has to try and fail Victoza, Bydureon, and Byetta first before they will approve the Trulicity

## 2019-05-29 NOTE — Telephone Encounter (Signed)
I had to do a PA for the Bydureon, but it was approved for 1 year. I called the pharmacy to rerun the prescription and to go ahead and fill the medication for the patient.

## 2019-07-14 ENCOUNTER — Other Ambulatory Visit: Payer: Self-pay | Admitting: Emergency Medicine

## 2019-07-14 DIAGNOSIS — Z8639 Personal history of other endocrine, nutritional and metabolic disease: Secondary | ICD-10-CM

## 2019-07-14 DIAGNOSIS — E1165 Type 2 diabetes mellitus with hyperglycemia: Secondary | ICD-10-CM

## 2019-07-14 DIAGNOSIS — I10 Essential (primary) hypertension: Secondary | ICD-10-CM

## 2019-07-14 MED ORDER — GLIPIZIDE 5 MG PO TABS: 5.0000 mg | ORAL_TABLET | Freq: Two times a day (BID) | ORAL | 0 refills | Status: DC

## 2019-07-14 MED ORDER — AMLODIPINE BESYLATE 10 MG PO TABS: 10.0000 mg | ORAL_TABLET | Freq: Every day | ORAL | 0 refills | Status: DC

## 2019-07-14 MED ORDER — POTASSIUM CHLORIDE CRYS ER 10 MEQ PO TBCR: 10.0000 meq | EXTENDED_RELEASE_TABLET | Freq: Two times a day (BID) | ORAL | 0 refills | Status: DC

## 2019-07-14 MED ORDER — CARVEDILOL 12.5 MG PO TABS: 12.5000 mg | ORAL_TABLET | Freq: Two times a day (BID) | ORAL | 0 refills | Status: DC

## 2019-07-14 NOTE — Telephone Encounter (Signed)
Refill request for:    potassium chloride 10 meq, has end date for 06/25/2019         Glipizide 5 mg tab has an end date of 03/12/2019,         Carvedilol 12.5 mg has an end date of 03/12/2019

## 2019-07-17 ENCOUNTER — Encounter: Payer: Self-pay | Admitting: Emergency Medicine

## 2019-07-23 ENCOUNTER — Other Ambulatory Visit: Payer: Self-pay

## 2019-07-23 ENCOUNTER — Ambulatory Visit (INDEPENDENT_AMBULATORY_CARE_PROVIDER_SITE_OTHER): Payer: Medicaid Other | Admitting: Family Medicine

## 2019-07-23 ENCOUNTER — Encounter: Payer: Self-pay | Admitting: Family Medicine

## 2019-07-23 ENCOUNTER — Ambulatory Visit (INDEPENDENT_AMBULATORY_CARE_PROVIDER_SITE_OTHER): Payer: Medicaid Other

## 2019-07-23 VITALS — BP 159/100 | HR 80 | Temp 98.6°F | Wt 283.2 lb

## 2019-07-23 DIAGNOSIS — E1165 Type 2 diabetes mellitus with hyperglycemia: Secondary | ICD-10-CM

## 2019-07-23 DIAGNOSIS — R0781 Pleurodynia: Secondary | ICD-10-CM

## 2019-07-23 NOTE — Progress Notes (Signed)
Patient ID: William Zuniga, male    DOB: 1980/10/18  Age: 39 y.o. MRN: [id number]  Chief Complaint  Patient presents with  . Pain    Pain in right rib/flank for the past 3 weeks with n known injury    Subjective:   39 year old male who is accompanied by his caregiver.  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  He has a 2-week history of left rib pain.  Knows of no injury.  He hurts in the left chest wall, pointing about halfway down the rib cage at the mid axillary line.  If he turns on it, especially rolling in bed, it causes terrible pain.  He says he sleeps on his stomach and just had a hard time resting because of the pain.  Mopped assist.  Has not had any cough.  Has not had Covid.  No other associated complaints.  Patient is diabetes.  His blood sugars appear that he has been in poor control.  He is followed for this.  Current allergies, medications, problem list, past/family and social histories reviewed.  Objective:  BP (!) 159/100 (BP Location: Right Arm, Patient Position: Sitting, Cuff Size: Large)   Pulse 80   Temp 98.6 F (37 C) (Temporal)   Wt 283 lb 3.2 oz (128.5 kg)   SpO2 (!) 87%   BMI 41.52 kg/m   No acute distress.  Obese young man, blind with a large beard.  Chest clear to auscultation.  Heart regular without murmurs, gallops, or arrhythmias.  Chest wall is very tender on the left mid axillary line about the eighth rib level, fairly point tenderness.  I do not feel any nodules but they felt like it was swollen yesterday.  Assessment & Plan:   Assessment: 1. Rib pain on left side   2. Type 2 diabetes mellitus with hyperglycemia, without long-term current use of insulin (HCC)       Plan: X-ray ribs.  See instructions.  Is not supposed to take NSAIDs because of his intracranial hypertension and acetazolamide treatment.  I think he can use a little diclofenac gel in the area and see if that helps at all.  Orders Placed This Encounter  Procedures  .  DG Ribs Unilateral W/Chest Left    Does not know of a specific injury, but has point tenderness about the left mid axillary ribs around the eighth rib.    Standing Status:   Future    Number of Occurrences:   1    Standing Expiration Date:   07/22/2020    Order Specific Question:   Reason for Exam (SYMPTOM  OR DIAGNOSIS REQUIRED)    Answer:   rib pain    Order Specific Question:   Preferred imaging location?    Answer:   External    No orders of the defined types were placed in this encounter.        Patient Instructions     Your x-rays are normal.  You probably have just bruised the chest wall or strained the muscles on the ribs.   You can take Tylenol for pain.  Also you can try using some Voltaren gel (diclofenac gel) that you can buy over-the-counter and rub some into that area that is paining you about 3 or 4 times a day.  That will sometimes give some relief of pain and inflammation.  As you explained to me, you should not take ibuprofen or Aleve.  This should gradually subside with time.  If it does not  improve over the next month you should get rechecked.  Return sooner problems are worse.     If you have lab work done today you will be contacted with your lab results within the next 2 weeks.  If you have not heard from [place] then please contact us. The fastest way to get your results is to register for My Chart.   IF you received an x-ray today, you will receive an invoice from [facility]. Please contact [facility] at [phone] with questions or concerns regarding your invoice.   IF you received labwork today, you will receive an invoice from [place]. Please contact [facility] at [phone] with questions or concerns regarding your invoice.   Our billing staff will not be able to assist you with questions regarding bills from these companies.  You will be contacted with the lab results as soon as they are available. The fastest way to get your  results is to activate your My Chart account. Instructions are located on the last page of this paperwork. If you have not heard from [place] regarding the results in 2 weeks, please contact this office.         Return if symptoms worsen or fail to improve.   Ruben Reason, MD 07/23/2019

## 2019-07-23 NOTE — Patient Instructions (Addendum)
   Your x-rays are normal.  You probably have just bruised the chest wall or strained the muscles on the ribs.   You can take Tylenol for pain.  Also you can try using some Voltaren gel (diclofenac gel) that you can buy over-the-counter and rub some into that area that is paining you about 3 or 4 times a day.  That will sometimes give some relief of pain and inflammation.  As you explained to me, you should not take ibuprofen or Aleve.  This should gradually subside with time.  If it does not improve over the next month you should get rechecked.  Return sooner problems are worse.     If you have lab work done today you will be contacted with your lab results within the next 2 weeks.  If you have not heard from [place] then please contact us. The fastest way to get your results is to register for My Chart.   IF you received an x-ray today, you will receive an invoice from [facility]. Please contact [facility] at [phone] with questions or concerns regarding your invoice.   IF you received labwork today, you will receive an invoice from [place]. Please contact [facility] at [phone] with questions or concerns regarding your invoice.   Our billing staff will not be able to assist you with questions regarding bills from these companies.  You will be contacted with the lab results as soon as they are available. The fastest way to get your results is to activate your My Chart account. Instructions are located on the last page of this paperwork. If you have not heard from [place] regarding the results in 2 weeks, please contact this office.

## 2019-08-20 ENCOUNTER — Encounter: Payer: Self-pay | Admitting: Emergency Medicine

## 2019-08-20 ENCOUNTER — Other Ambulatory Visit: Payer: Self-pay

## 2019-08-20 ENCOUNTER — Ambulatory Visit (INDEPENDENT_AMBULATORY_CARE_PROVIDER_SITE_OTHER): Payer: Medicaid Other | Admitting: Emergency Medicine

## 2019-08-20 VITALS — BP 177/111 | HR 93 | Temp 98.0°F | Resp 16 | Ht 69.0 in | Wt 283.0 lb

## 2019-08-20 DIAGNOSIS — E1159 Type 2 diabetes mellitus with other circulatory complications: Secondary | ICD-10-CM | POA: Diagnosis not present

## 2019-08-20 DIAGNOSIS — E1165 Type 2 diabetes mellitus with hyperglycemia: Secondary | ICD-10-CM

## 2019-08-20 DIAGNOSIS — G932 Benign intracranial hypertension: Secondary | ICD-10-CM

## 2019-08-20 DIAGNOSIS — I10 Essential (primary) hypertension: Secondary | ICD-10-CM | POA: Diagnosis not present

## 2019-08-20 DIAGNOSIS — H548 Legal blindness, as defined in USA: Secondary | ICD-10-CM

## 2019-08-20 DIAGNOSIS — I152 Hypertension secondary to endocrine disorders: Secondary | ICD-10-CM

## 2019-08-20 LAB — POCT GLYCOSYLATED HEMOGLOBIN (HGB A1C): Hemoglobin A1C: 12.3 % — AB (ref 4.0–5.6)

## 2019-08-20 LAB — GLUCOSE, POCT (MANUAL RESULT ENTRY): POC Glucose: 432 mg/dl — AB (ref 70–99)

## 2019-08-20 MED ORDER — GLIPIZIDE 5 MG PO TABS: 5.0000 mg | ORAL_TABLET | Freq: Two times a day (BID) | ORAL | 3 refills | Status: DC

## 2019-08-20 MED ORDER — GLIPIZIDE 10 MG PO TABS: 10.0000 mg | ORAL_TABLET | Freq: Two times a day (BID) | ORAL | 3 refills | Status: AC

## 2019-08-20 MED ORDER — JARDIANCE 25 MG PO TABS: 25.0000 mg | ORAL_TABLET | Freq: Every day | ORAL | 3 refills | Status: AC

## 2019-08-20 MED ORDER — LOSARTAN POTASSIUM 100 MG PO TABS: 100.0000 mg | ORAL_TABLET | Freq: Every day | ORAL | 3 refills | Status: AC

## 2019-08-20 MED ORDER — AMLODIPINE BESYLATE 10 MG PO TABS: 10.0000 mg | ORAL_TABLET | Freq: Every day | ORAL | 3 refills | Status: DC

## 2019-08-20 MED ORDER — LOSARTAN POTASSIUM 100 MG PO TABS: 100.0000 mg | ORAL_TABLET | Freq: Every day | ORAL | 3 refills | Status: DC

## 2019-08-20 MED ORDER — AMLODIPINE BESYLATE 10 MG PO TABS: 10.0000 mg | ORAL_TABLET | Freq: Every day | ORAL | 3 refills | Status: AC

## 2019-08-20 MED ORDER — GLIPIZIDE 10 MG PO TABS: 10.0000 mg | ORAL_TABLET | Freq: Two times a day (BID) | ORAL | Status: DC

## 2019-08-20 MED ORDER — CLONIDINE HCL 0.1 MG PO TABS: 0.1000 mg | ORAL_TABLET | Freq: Two times a day (BID) | ORAL | 1 refills | Status: AC

## 2019-08-20 NOTE — Progress Notes (Signed)
BP Readings from Last 3 Encounters:  08/20/19 (!) 177/111  07/23/19 (!) 159/100  05/20/19 (!) 167/109   William Zuniga 39 y.o.   Chief Complaint  Patient presents with  . Diabetes    3 month follow up  . Hypertension    HISTORY OF PRESENT ILLNESS: This is a 39 y.o. male with history of diabetes and hypertension here for follow-up and medication refills. 1.  Malignant hypertension: Presently on amlodipine 10 mg, losartan 100 mg, Coreg 12.5 mg twice a day.  Blood pressure readings at home still consistently high.  Asymptomatic. 2.  Diabetes: Blood glucose at home 150s.  Presently on glipizide 5 mg twice a day Bydureon 2 mg weekly injection and Januvia 50 mg daily.  Takes rosuvastatin 20 mg daily. 3.  History of idiopathic intracranial hypertension: Takes Diamox 500 mg twice a day.  HPI   Prior to Admission medications   Medication Sig Start Date End Date Taking? Authorizing Provider  acetaZOLAMIDE (DIAMOX) 500 MG capsule Take 500 mg by mouth 2 (two) times daily.   Yes [provider]  Exenatide ER (BYDUREON) 2 MG PEN Inject 2 mg into the skin once a week. 05/28/19 08/26/19 Yes Daila Elbert, Ines Bloomer, MD  labetalol (NORMODYNE,TRANDATE) 5 MG/ML injection Inject 2 mLs (10 mg total) into the vein every 6 (six) hours as needed (SBP > 160). 07/30/17  Yes Rice, Resa Miner, MD  losartan (COZAAR) 100 MG tablet Take 1 tablet (100 mg total) by mouth daily. 08/20/19  Yes Kelvin Burpee, Ines Bloomer, MD  omeprazole (PRILOSEC) 40 MG capsule Take 1 capsule (40 mg total) by mouth daily. 12/18/17  Yes Felishia Wartman, Ines Bloomer, MD  rosuvastatin (CRESTOR) 20 MG tablet Take 1 tablet (20 mg total) by mouth daily. 05/20/19  Yes Nuri Larmer, Ines Bloomer, MD  acetaminophen (TYLENOL) 500 MG tablet Take 2 tablets (1,000 mg total) by mouth every 6 (six) hours as needed for mild pain, moderate pain or fever. Patient not taking: Reported on 08/20/2019 07/30/17   Collier Salina, MD  acetaZOLAMIDE (DIAMOX) 250  MG tablet Take 2 tablets (500 mg total) by mouth 2 (two) times daily. 05/20/19 08/18/19  Horald Pollen, MD  amLODipine (NORVASC) 10 MG tablet Take 1 tablet (10 mg total) by mouth daily. 08/20/19 11/18/19  Horald Pollen, MD  blood glucose meter kit and supplies KIT Dispense based on patient and insurance preference. Use up to four times daily as directed. (FOR ICD-9 250.00, 250.01). 02/10/19   Horald Pollen, MD  carvedilol (COREG) 12.5 MG tablet Take 1 tablet (12.5 mg total) by mouth 2 (two) times daily with a meal. 07/14/19 08/13/19  Katerina Zurn, Ines Bloomer, MD  cloNIDine (CATAPRES) 0.1 MG tablet Take 1 tablet (0.1 mg total) by mouth 2 (two) times daily. 08/20/19 11/18/19  Horald Pollen, MD  empagliflozin (JARDIANCE) 25 MG TABS tablet Take 25 mg by mouth daily before breakfast. 08/20/19 11/18/19  Horald Pollen, MD  glipiZIDE (GLUCOTROL) 10 MG tablet Take 1 tablet (10 mg total) by mouth 2 (two) times daily with a meal. 08/20/19 11/18/19  Jazmin Ley, Ines Bloomer, MD  Insulin Degludec (TRESIBA) 100 UNIT/ML SOLN Inject 10 Units into the skin daily. Patient not taking: Reported on 08/20/2019 02/25/19   Horald Pollen, MD  ondansetron (ZOFRAN) 4 MG tablet Take 1 tablet (4 mg total) by mouth every 8 (eight) hours as needed for nausea or vomiting. Patient not taking: Reported on 08/20/2019 12/18/17   Horald Pollen, MD  potassium chloride (KLOR-CON)  10 MEQ tablet Take 1 tablet (10 mEq total) by mouth 2 (two) times daily. MUST BE SEEN IN OFFICE FOR ADDITIONAL REFILLS 07/14/19 08/13/19  Horald Pollen, MD  acetaZOLAMIDE (DIAMOX) 250 MG tablet Take 2 tablets (500 mg total) by mouth 2 (two) times daily. 02/10/19   Horald Pollen, MD    Allergies  Allergen Reactions  . Lisinopril Swelling    legs  . Shellfish Allergy     Patient Active Problem List   Diagnosis Date Noted  . GERD with esophagitis 12/18/2017  . Legally blind 12/17/2017  . Dyslipidemia associated with  type 2 diabetes mellitus ([place]) 11/29/2017  . Essential hypertension, malignant 08/30/2017  . Idiopathic intracranial hypertension 08/30/2017  . History of hypokalemia 08/30/2017  . Hypertension associated with diabetes ([place]) 07/26/2017  . 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 MODY (maturity onset diabetes mellitus in young) ([place]) 07/26/2017    Past Medical History:  Diagnosis Date  . Diabetes mellitus without complication ([place])   . Hypertension   . IIH (idiopathic intracranial hypertension)     Past Surgical History:  Procedure Laterality Date  . EYE SURGERY      Social History   Socioeconomic History  . Marital status: Single    Spouse name: Not on file  . Number of children: 7  . Years of education: Not on file  . Highest education level: Not on file  Occupational History  . Not on file  Tobacco Use  . Smoking status: Never Smoker  . Smokeless tobacco: Never Used  Substance and Sexual Activity  . Alcohol use: No  . Drug use: Yes    Types: Marijuana  . Sexual activity: Yes  Other Topics Concern  . Not on file  Social History Narrative  . Not on file   Social Determinants of Health   Financial Resource Strain:   . Difficulty of Paying Living Expenses: Not on file  Food Insecurity:   . Worried About Charity fundraiser in the Last Year: Not on file  . Ran Out of Food in the Last Year: Not on file  Transportation Needs:   . Lack of Transportation (Medical): Not on file  . Lack of Transportation (Non-Medical): Not on file  Physical Activity:   . Days of Exercise per Week: Not on file  . Minutes of Exercise per Session: Not on file  Stress:   . Feeling of Stress : Not on file  Social Connections:   . Frequency of Communication with Friends and Family: Not on file  . Frequency of Social Gatherings with Friends and Family: Not on file  . Attends Religious Services: Not on file  . Active Member of Clubs or Organizations: Not on file  . Attends Archivist  Meetings: Not on file  . Marital Status: Not on file  Intimate Partner Violence:   . Fear of Current or Ex-Partner: Not on file  . Emotionally Abused: Not on file  . Physically Abused: Not on file  . Sexually Abused: Not on file    History reviewed. No pertinent family history.   Review of Systems  Constitutional: Negative.  Negative for chills and fever.  HENT: Negative.  Negative for congestion and sore throat.   Eyes:       Legally blind  Respiratory: Negative.  Negative for cough and shortness of breath.   Cardiovascular: Negative.  Negative for chest pain and palpitations.  Gastrointestinal: Negative.  Negative for abdominal pain, blood in stool, diarrhea, melena, nausea and  vomiting.  Genitourinary: Negative.  Negative for dysuria and hematuria.  Musculoskeletal: Negative.  Negative for back pain, myalgias and neck pain.  Skin: Negative.  Negative for rash.  Neurological: Negative.  Negative for dizziness and headaches.  Endo/Heme/Allergies: Negative.   All other systems reviewed and are negative.   Today's Vitals   08/20/19 1537  BP: (!) 177/111  Pulse: 93  Resp: 16  Temp: 98 F (36.7 C)  TempSrc: Temporal  SpO2: 97%  Weight: 283 lb (128.4 kg)  Height: _0  (1.753 m)   Body mass index is 41.79 kg/m.  Physical Exam Vitals reviewed.  Constitutional:      Appearance: Normal appearance.  HENT:     Head: Normocephalic.  Cardiovascular:     Rate and Rhythm: Normal rate and regular rhythm.     Heart sounds: Normal heart sounds.  Pulmonary:     Effort: Pulmonary effort is normal.     Breath sounds: Normal breath sounds.  Musculoskeletal:        General: Normal range of motion.     Cervical back: Normal range of motion.  Skin:    General: Skin is warm and dry.     Capillary Refill: Capillary refill takes less than 2 seconds.  Neurological:     General: No focal deficit present.     Mental Status: He is alert and oriented to person, place, and time.    Psychiatric:        Mood and Affect: Mood normal.        Behavior: Behavior normal.    A total of 45 minutes was spent with the patient, greater than 50% of which was in counseling/coordination of care regarding diabetes and hypertension, cardiovascular risks associated with these, treatment and management including diet, nutrition, and medications, review of all medications, change and addition of new medications, review of most recent office visit notes, review of most recent blood work, prognosis and need for follow-up in 3 months.   ASSESSMENT & PLAN: Hypertension associated with diabetes ([place]) Persistently elevated blood pressure. Continue losartan 100 mg daily.  Continue amlodipine 10 mg daily.  Continue carvedilol 12.5 mg twice a day.  We will add clonidine 0.1 mg twice a day. Uncontrolled diabetes with hemoglobin A1c at 12.3.  Unable to take metformin due to interaction with Diamox. Continue Bydureon 2 mg weekly.  Increase glipizide to 10 mg twice a day.  Stop Januvia and start Jardiance or Invokana as per insurance. Follow-up in 3 months.  Kagen was seen today for diabetes and hypertension.  Diagnoses and all orders for this visit:  Hypertension associated with diabetes ([place]) -     Comprehensive metabolic panel -     Lipid panel -     CBC with Differential/Platelet -     Discontinue: losartan (COZAAR) 100 MG tablet; Take 1 tablet (100 mg total) by mouth daily. -     losartan (COZAAR) 100 MG tablet; Take 1 tablet (100 mg total) by mouth daily.  Essential hypertension, malignant -     Discontinue: amLODipine (NORVASC) 10 MG tablet; Take 1 tablet (10 mg total) by mouth daily. -     cloNIDine (CATAPRES) 0.1 MG tablet; Take 1 tablet (0.1 mg total) by mouth 2 (two) times daily. -     amLODipine (NORVASC) 10 MG tablet; Take 1 tablet (10 mg total) by mouth daily.  Type 2 diabetes mellitus with hyperglycemia, without long-term current use of insulin ([facility]) -     POCT glucose  (manual  entry) -     POCT glycosylated hemoglobin (Hb A1C) -     Discontinue: glipiZIDE (GLUCOTROL) 5 MG tablet; Take 1 tablet (5 mg total) by mouth 2 (two) times daily with a meal. -     Discontinue: glipiZIDE (GLUCOTROL) 10 MG tablet; Take 1 tablet (10 mg total) by mouth 2 (two) times daily with a meal. -     empagliflozin (JARDIANCE) 25 MG TABS tablet; Take 25 mg by mouth daily before breakfast. -     glipiZIDE (GLUCOTROL) 10 MG tablet; Take 1 tablet (10 mg total) by mouth 2 (two) times daily with a meal.  Idiopathic intracranial hypertension  Legally blind    Patient Instructions   Continue losartan, carvedilol, and amlodipine.  Start clonidine 0.1 mg twice a day. Continue Bydureon weekly injections.  Increase glipizide to 10 mg twice a day.  Stop Januvia and start Jardiance or equivalent as prescribed. Follow-up in 3 months.    If you have lab work done today you will be contacted with your lab results within the next 2 weeks.  If you have not heard from [place] then please contact us. The fastest way to get your results is to register for My Chart.   IF you received an x-ray today, you will receive an invoice from [facility]. Please contact [facility] at [phone] with questions or concerns regarding your invoice.   IF you received labwork today, you will receive an invoice from [place]. Please contact [facility] at [phone] with questions or concerns regarding your invoice.   Our billing staff will not be able to assist you with questions regarding bills from these companies.  You will be contacted with the lab results as soon as they are available. The fastest way to get your results is to activate your My Chart account. Instructions are located on the last page of this paperwork. If you have not heard from [place] regarding the results in 2 weeks, please contact this office.     Diabetes Mellitus and Nutrition, Adult When you have diabetes (diabetes  mellitus), it is very important to have healthy eating habits because your blood sugar (glucose) levels are greatly affected by what you eat and drink. Eating healthy foods in the appropriate amounts, at about the same times every day, can help you:  Control your blood glucose.  Lower your risk of heart disease.  Improve your blood pressure.  Reach or maintain a healthy weight. Every person with diabetes is different, and each person has different needs for a meal plan. Your health care provider may recommend that you work with a diet and nutrition specialist (dietitian) to make a meal plan that is best for you. Your meal plan may vary depending on factors such as:  The calories you need.  The medicines you take.  Your weight.  Your blood glucose, blood pressure, and cholesterol levels.  Your activity level.  Other health conditions you have, such as heart or kidney disease. How do carbohydrates affect me? Carbohydrates, also called carbs, affect your blood glucose level more than any other type of food. Eating carbs naturally raises the amount of glucose in your blood. Carb counting is a method for keeping track of how many carbs you eat. Counting carbs is important to keep your blood glucose at a healthy level, especially if you use insulin or take certain oral diabetes medicines. It is important to know how many carbs you can safely have in each meal. This is different for every  person. Your dietitian can help you calculate how many carbs you should have at each meal and for each snack. Foods that contain carbs include:  Bread, cereal, rice, pasta, and crackers.  Potatoes and corn.  Peas, beans, and lentils.  Milk and yogurt.  Fruit and juice.  Desserts, such as cakes, cookies, ice cream, and candy. How does alcohol affect me? Alcohol can cause a sudden decrease in blood glucose (hypoglycemia), especially if you use insulin or take certain oral diabetes medicines.  Hypoglycemia can be a life-threatening condition. Symptoms of hypoglycemia (sleepiness, dizziness, and confusion) are similar to symptoms of having too much alcohol. If your health care provider says that alcohol is safe for you, follow these guidelines:  Limit alcohol intake to no more than 1 drink per day for nonpregnant women and 2 drinks per day for men. One drink equals 12 oz of beer, 5 oz of wine, or 1 oz of hard liquor.  Do not drink on an empty stomach.  Keep yourself hydrated with water, diet soda, or unsweetened iced tea.  Keep in mind that regular soda, juice, and other mixers may contain a lot of sugar and must be counted as carbs. What are tips for following this plan?  Reading food labels  Start by checking the serving size on the "Nutrition Facts" label of packaged foods and drinks. The amount of calories, carbs, fats, and other nutrients listed on the label is based on one serving of the item. Many items contain more than one serving per package.  Check the total grams (g) of carbs in one serving. You can calculate the number of servings of carbs in one serving by dividing the total carbs by 15. For example, if a food has 30 g of total carbs, it would be equal to 2 servings of carbs.  Check the number of grams (g) of saturated and trans fats in one serving. Choose foods that have low or no amount of these fats.  Check the number of milligrams (mg) of salt (sodium) in one serving. Most people should limit total sodium intake to less than 2,300 mg per day.  Always check the nutrition information of foods labeled as "low-fat" or "nonfat". These foods may be higher in added sugar or refined carbs and should be avoided.  Talk to your dietitian to identify your daily goals for nutrients listed on the label. Shopping  Avoid buying canned, premade, or processed foods. These foods tend to be high in fat, sodium, and added sugar.  Shop around the outside edge of the grocery store.  This includes fresh fruits and vegetables, bulk grains, fresh meats, and fresh dairy. Cooking  Use low-heat cooking methods, such as baking, instead of high-heat cooking methods like deep frying.  Cook using healthy oils, such as olive, canola, or sunflower oil.  Avoid cooking with butter, cream, or high-fat meats. Meal planning  Eat meals and snacks regularly, preferably at the same times every day. Avoid going long periods of time without eating.  Eat foods high in fiber, such as fresh fruits, vegetables, beans, and whole grains. Talk to your dietitian about how many servings of carbs you can eat at each meal.  Eat 4-6 ounces (oz) of lean protein each day, such as lean meat, chicken, fish, eggs, or tofu. One oz of lean protein is equal to: ? 1 oz of meat, chicken, or fish. ? 1 egg. ?  cup of tofu.  Eat some foods each day that contain healthy fats,  such as avocado, nuts, seeds, and fish. Lifestyle  Check your blood glucose regularly.  Exercise regularly as told by your health care provider. This may include: ? 150 minutes of moderate-intensity or vigorous-intensity exercise each week. This could be brisk walking, biking, or water aerobics. ? Stretching and doing strength exercises, such as yoga or weightlifting, at least 2 times a week.  Take medicines as told by your health care provider.  Do not use any products that contain nicotine or tobacco, such as cigarettes and e-cigarettes. If you need help quitting, ask your health care provider.  Work with a Social worker or diabetes educator to identify strategies to manage stress and any emotional and social challenges. Questions to ask a health care provider  Do I need to meet with a diabetes educator?  Do I need to meet with a dietitian?  What number can I call if I have questions?  When are the best times to check my blood glucose? Where to find more information:  American Diabetes Association: diabetes.org  Academy of  Nutrition and Dietetics: www.eatright.[employer] of Diabetes and Digestive and Kidney Diseases (NIH): [employer] Summary  A healthy meal plan will help you control your blood glucose and maintain a healthy lifestyle.  Working with a diet and nutrition specialist (dietitian) can help you make a meal plan that is best for you.  Keep in mind that carbohydrates (carbs) and alcohol have immediate effects on your blood glucose levels. It is important to count carbs and to use alcohol carefully. This information is not intended to replace advice given to you by your health care provider. Make sure you discuss any questions you have with your health care provider. Document Revised: 06/07/2017 Document Reviewed: 07/30/2016 Elsevier Patient Education  [place].  Hypertension, Adult High blood pressure (hypertension) is when the force of blood pumping through the arteries is too strong. The arteries are the blood vessels that carry blood from the heart throughout the body. Hypertension forces the heart to work harder to pump blood and may cause arteries to become narrow or stiff. Untreated or uncontrolled hypertension can cause a heart attack, heart failure, a stroke, kidney disease, and other problems. A blood pressure reading consists of a higher number over a lower number. Ideally, your blood pressure should be below 120/80. The first ("top") number is called the systolic pressure. It is a measure of the pressure in your arteries as your heart beats. The second ("bottom") number is called the diastolic pressure. It is a measure of the pressure in your arteries as the heart relaxes. What are the causes? The exact cause of this condition is not known. There are some conditions that result in or are related to high blood pressure. What increases the risk? Some risk factors for high blood pressure are under your control. The following factors may make you more likely to develop  this condition:  Smoking.  Having type 2 diabetes mellitus, high cholesterol, or both.  Not getting enough exercise or physical activity.  Being overweight.  Having too much fat, sugar, calories, or salt (sodium) in your diet.  Drinking too much alcohol. Some risk factors for high blood pressure may be difficult or impossible to change. Some of these factors include:  Having chronic kidney disease.  Having a family history of high blood pressure.  Age. Risk increases with age.  Race. You may be at higher risk if you are African American.  Gender. Men are at higher risk than  women before age 21. After age 19, women are at higher risk than men.  Having obstructive sleep apnea.  Stress. What are the signs or symptoms? High blood pressure may not cause symptoms. Very high blood pressure (hypertensive crisis) may cause:  Headache.  Anxiety.  Shortness of breath.  Nosebleed.  Nausea and vomiting.  Vision changes.  Severe chest pain.  Seizures. How is this diagnosed? This condition is diagnosed by measuring your blood pressure while you are seated, with your arm resting on a flat surface, your legs uncrossed, and your feet flat on the floor. The cuff of the blood pressure monitor will be placed directly against the skin of your upper arm at the level of your heart. It should be measured at least twice using the same arm. Certain conditions can cause a difference in blood pressure between your right and left arms. Certain factors can cause blood pressure readings to be lower or higher than normal for a short period of time:  When your blood pressure is higher when you are in a health care provider's office than when you are at home, this is called white coat hypertension. Most people with this condition do not need medicines.  When your blood pressure is higher at home than when you are in a health care provider's office, this is called masked hypertension. Most people  with this condition may need medicines to control blood pressure. If you have a high blood pressure reading during one visit or you have normal blood pressure with other risk factors, you may be asked to:  Return on a different day to have your blood pressure checked again.  Monitor your blood pressure at home for 1 week or longer. If you are diagnosed with hypertension, you may have other blood or imaging tests to help your health care provider understand your overall risk for other conditions. How is this treated? This condition is treated by making healthy lifestyle changes, such as eating healthy foods, exercising more, and reducing your alcohol intake. Your health care provider may prescribe medicine if lifestyle changes are not enough to get your blood pressure under control, and if:  Your systolic blood pressure is above 130.  Your diastolic blood pressure is above 80. Your personal target blood pressure may vary depending on your medical conditions, your age, and other factors. Follow these instructions at home: Eating and drinking   Eat a diet that is high in fiber and potassium, and low in sodium, added sugar, and fat. An example eating plan is called the DASH (Dietary Approaches to Stop Hypertension) diet. To eat this way: ? Eat plenty of fresh fruits and vegetables. Try to fill one half of your plate at each meal with fruits and vegetables. ? Eat whole grains, such as whole-wheat pasta, brown rice, or whole-grain bread. Fill about one fourth of your plate with whole grains. ? Eat or drink low-fat dairy products, such as skim milk or low-fat yogurt. ? Avoid fatty cuts of meat, processed or cured meats, and poultry with skin. Fill about one fourth of your plate with lean proteins, such as fish, chicken without skin, beans, eggs, or tofu. ? Avoid pre-made and processed foods. These tend to be higher in sodium, added sugar, and fat.  Reduce your daily sodium intake. Most people with  hypertension should eat less than 1,500 mg of sodium a day.  Do not drink alcohol if: ? Your health care provider tells you not to drink. ? You are pregnant,  may be pregnant, or are planning to become pregnant.  If you drink alcohol: ? Limit how much you use to:  0-1 drink a day for women.  0-2 drinks a day for men. ? Be aware of how much alcohol is in your drink. In the [place] one drink equals one 12 oz bottle of beer (355 mL), one 5 oz glass of wine (148 mL), or one 1 oz glass of hard liquor (44 mL). Lifestyle   Work with your health care provider to maintain a healthy body weight or to lose weight. Ask what an ideal weight is for you.  Get at least 30 minutes of exercise most days of the week. Activities may include walking, swimming, or biking.  Include exercise to strengthen your muscles (resistance exercise), such as Pilates or lifting weights, as part of your weekly exercise routine. Try to do these types of exercises for 30 minutes at least 3 days a week.  Do not use any products that contain nicotine or tobacco, such as cigarettes, e-cigarettes, and chewing tobacco. If you need help quitting, ask your health care provider.  Monitor your blood pressure at home as told by your health care provider.  Keep all follow-up visits as told by your health care provider. This is important. Medicines  Take over-the-counter and prescription medicines only as told by your health care provider. Follow directions carefully. Blood pressure medicines must be taken as prescribed.  Do not skip doses of blood pressure medicine. Doing this puts you at risk for problems and can make the medicine less effective.  Ask your health care provider about side effects or reactions to medicines that you should watch for. Contact a health care provider if you:  Think you are having a reaction to a medicine you are taking.  Have headaches that keep coming back (recurring).  Feel dizzy.  Have  swelling in your ankles.  Have trouble with your vision. Get help right away if you:  Develop a severe headache or confusion.  Have unusual weakness or numbness.  Feel faint.  Have severe pain in your chest or abdomen.  Vomit repeatedly.  Have trouble breathing. Summary  Hypertension is when the force of blood pumping through your arteries is too strong. If this condition is not controlled, it may put you at risk for serious complications.  Your personal target blood pressure may vary depending on your medical conditions, your age, and other factors. For most people, a normal blood pressure is less than 120/80.  Hypertension is treated with lifestyle changes, medicines, or a combination of both. Lifestyle changes include losing weight, eating a healthy, low-sodium diet, exercising more, and limiting alcohol. This information is not intended to replace advice given to you by your health care provider. Make sure you discuss any questions you have with your health care provider. Document Revised: 03/05/2018 Document Reviewed: 03/05/2018 Elsevier Patient Education  2020 Elsevier Inc.      Agustina Caroli, MD [facility]

## 2019-08-20 NOTE — Patient Instructions (Addendum)
Continue losartan, carvedilol, and amlodipine.  Start clonidine 0.1 mg twice a day. Continue Bydureon weekly injections.  Increase glipizide to 10 mg twice a day.  Stop Januvia and start Jardiance or equivalent as prescribed. Follow-up in 3 months.    If you have lab work done today you will be contacted with your lab results within the next 2 weeks.  If you have not heard from [place] then please contact us. The fastest way to get your results is to register for My Chart.   IF you received an x-ray today, you will receive an invoice from [facility]. Please contact [facility] at [phone] with questions or concerns regarding your invoice.   IF you received labwork today, you will receive an invoice from [place]. Please contact [facility] at [phone] with questions or concerns regarding your invoice.   Our billing staff will not be able to assist you with questions regarding bills from these companies.  You will be contacted with the lab results as soon as they are available. The fastest way to get your results is to activate your My Chart account. Instructions are located on the last page of this paperwork. If you have not heard from [place] regarding the results in 2 weeks, please contact this office.     Diabetes Mellitus and Nutrition, Adult When you have diabetes (diabetes mellitus), it is very important to have healthy eating habits because your blood sugar (glucose) levels are greatly affected by what you eat and drink. Eating healthy foods in the appropriate amounts, at about the same times every day, can help you:  Control your blood glucose.  Lower your risk of heart disease.  Improve your blood pressure.  Reach or maintain a healthy weight. Every person with diabetes is different, and each person has different needs for a meal plan. Your health care provider may recommend that you work with a diet and nutrition specialist (dietitian) to make a meal plan that is  best for you. Your meal plan may vary depending on factors such as:  The calories you need.  The medicines you take.  Your weight.  Your blood glucose, blood pressure, and cholesterol levels.  Your activity level.  Other health conditions you have, such as heart or kidney disease. How do carbohydrates affect me? Carbohydrates, also called carbs, affect your blood glucose level more than any other type of food. Eating carbs naturally raises the amount of glucose in your blood. Carb counting is a method for keeping track of how many carbs you eat. Counting carbs is important to keep your blood glucose at a healthy level, especially if you use insulin or take certain oral diabetes medicines. It is important to know how many carbs you can safely have in each meal. This is different for every person. Your dietitian can help you calculate how many carbs you should have at each meal and for each snack. Foods that contain carbs include:  Bread, cereal, rice, pasta, and crackers.  Potatoes and corn.  Peas, beans, and lentils.  Milk and yogurt.  Fruit and juice.  Desserts, such as cakes, cookies, ice cream, and candy. How does alcohol affect me? Alcohol can cause a sudden decrease in blood glucose (hypoglycemia), especially if you use insulin or take certain oral diabetes medicines. Hypoglycemia can be a life-threatening condition. Symptoms of hypoglycemia (sleepiness, dizziness, and confusion) are similar to symptoms of having too much alcohol. If your health care provider says that alcohol is safe for you, follow these  guidelines:  Limit alcohol intake to no more than 1 drink per day for nonpregnant women and 2 drinks per day for men. One drink equals 12 oz of beer, 5 oz of wine, or 1 oz of hard liquor.  Do not drink on an empty stomach.  Keep yourself hydrated with water, diet soda, or unsweetened iced tea.  Keep in mind that regular soda, juice, and other mixers may contain a lot of  sugar and must be counted as carbs. What are tips for following this plan?  Reading food labels  Start by checking the serving size on the "Nutrition Facts" label of packaged foods and drinks. The amount of calories, carbs, fats, and other nutrients listed on the label is based on one serving of the item. Many items contain more than one serving per package.  Check the total grams (g) of carbs in one serving. You can calculate the number of servings of carbs in one serving by dividing the total carbs by 15. For example, if a food has 30 g of total carbs, it would be equal to 2 servings of carbs.  Check the number of grams (g) of saturated and trans fats in one serving. Choose foods that have low or no amount of these fats.  Check the number of milligrams (mg) of salt (sodium) in one serving. Most people should limit total sodium intake to less than 2,300 mg per day.  Always check the nutrition information of foods labeled as "low-fat" or "nonfat". These foods may be higher in added sugar or refined carbs and should be avoided.  Talk to your dietitian to identify your daily goals for nutrients listed on the label. Shopping  Avoid buying canned, premade, or processed foods. These foods tend to be high in fat, sodium, and added sugar.  Shop around the outside edge of the grocery store. This includes fresh fruits and vegetables, bulk grains, fresh meats, and fresh dairy. Cooking  Use low-heat cooking methods, such as baking, instead of high-heat cooking methods like deep frying.  Cook using healthy oils, such as olive, canola, or sunflower oil.  Avoid cooking with butter, cream, or high-fat meats. Meal planning  Eat meals and snacks regularly, preferably at the same times every day. Avoid going long periods of time without eating.  Eat foods high in fiber, such as fresh fruits, vegetables, beans, and whole grains. Talk to your dietitian about how many servings of carbs you can eat at each  meal.  Eat 4-6 ounces (oz) of lean protein each day, such as lean meat, chicken, fish, eggs, or tofu. One oz of lean protein is equal to: ? 1 oz of meat, chicken, or fish. ? 1 egg. ?  cup of tofu.  Eat some foods each day that contain healthy fats, such as avocado, nuts, seeds, and fish. Lifestyle  Check your blood glucose regularly.  Exercise regularly as told by your health care provider. This may include: ? 150 minutes of moderate-intensity or vigorous-intensity exercise each week. This could be brisk walking, biking, or water aerobics. ? Stretching and doing strength exercises, such as yoga or weightlifting, at least 2 times a week.  Take medicines as told by your health care provider.  Do not use any products that contain nicotine or tobacco, such as cigarettes and e-cigarettes. If you need help quitting, ask your health care provider.  Work with a Veterinary surgeon or diabetes educator to identify strategies to manage stress and any emotional and social challenges. Questions to  ask a health care provider  Do I need to meet with a diabetes educator?  Do I need to meet with a dietitian?  What number can I call if I have questions?  When are the best times to check my blood glucose? Where to find more information:  American Diabetes Association: diabetes.org  Academy of Nutrition and Dietetics: www.eatright.[employer] of Diabetes and Digestive and Kidney Diseases (NIH): CarFli[employer].tn Summary  A healthy meal plan will help you control your blood glucose and maintain a healthy lifestyle.  Working with a diet and nutrition specialist (dietitian) can help you make a meal plan that is best for you.  Keep in mind that carbohydrates (carbs) and alcohol have immediate effects on your blood glucose levels. It is important to count carbs and to use alcohol carefully. This information is not intended to replace advice given to you by your health care provider. Make sure  you discuss any questions you have with your health care provider. Document Revised: 06/07/2017 Document Reviewed: 07/30/2016 Elsevier Patient Education  2020 [employer].  Hypertension, Adult High blood pressure (hypertension) is when the force of blood pumping through the arteries is too strong. The arteries are the blood vessels that carry blood from the heart throughout the body. Hypertension forces the heart to work harder to pump blood and may cause arteries to become narrow or stiff. Untreated or uncontrolled hypertension can cause a heart attack, heart failure, a stroke, kidney disease, and other problems. A blood pressure reading consists of a higher number over a lower number. Ideally, your blood pressure should be below 120/80. The first ("top") number is called the systolic pressure. It is a measure of the pressure in your arteries as your heart beats. The second ("bottom") number is called the diastolic pressure. It is a measure of the pressure in your arteries as the heart relaxes. What are the causes? The exact cause of this condition is not known. There are some conditions that result in or are related to high blood pressure. What increases the risk? Some risk factors for high blood pressure are under your control. The following factors may make you more likely to develop this condition:  Smoking.  Having type 2 diabetes mellitus, high cholesterol, or both.  Not getting enough exercise or physical activity.  Being overweight.  Having too much fat, sugar, calories, or salt (sodium) in your diet.  Drinking too much alcohol. Some risk factors for high blood pressure may be difficult or impossible to change. Some of these factors include:  Having chronic kidney disease.  Having a family history of high blood pressure.  Age. Risk increases with age.  Race. You may be at higher risk if you are African American.  Gender. Men are at higher risk than women before age 34.  After age 1, women are at higher risk than men.  Having obstructive sleep apnea.  Stress. What are the signs or symptoms? High blood pressure may not cause symptoms. Very high blood pressure (hypertensive crisis) may cause:  Headache.  Anxiety.  Shortness of breath.  Nosebleed.  Nausea and vomiting.  Vision changes.  Severe chest pain.  Seizures. How is this diagnosed? This condition is diagnosed by measuring your blood pressure while you are seated, with your arm resting on a flat surface, your legs uncrossed, and your feet flat on the floor. The cuff of the blood pressure monitor will be placed directly against the skin of your upper arm at the level  of your heart. It should be measured at least twice using the same arm. Certain conditions can cause a difference in blood pressure between your right and left arms. Certain factors can cause blood pressure readings to be lower or higher than normal for a short period of time:  When your blood pressure is higher when you are in a health care provider's office than when you are at home, this is called white coat hypertension. Most people with this condition do not need medicines.  When your blood pressure is higher at home than when you are in a health care provider's office, this is called masked hypertension. Most people with this condition may need medicines to control blood pressure. If you have a high blood pressure reading during one visit or you have normal blood pressure with other risk factors, you may be asked to:  Return on a different day to have your blood pressure checked again.  Monitor your blood pressure at home for 1 week or longer. If you are diagnosed with hypertension, you may have other blood or imaging tests to help your health care provider understand your overall risk for other conditions. How is this treated? This condition is treated by making healthy lifestyle changes, such as eating healthy foods,  exercising more, and reducing your alcohol intake. Your health care provider may prescribe medicine if lifestyle changes are not enough to get your blood pressure under control, and if:  Your systolic blood pressure is above 130.  Your diastolic blood pressure is above 80. Your personal target blood pressure may vary depending on your medical conditions, your age, and other factors. Follow these instructions at home: Eating and drinking   Eat a diet that is high in fiber and potassium, and low in sodium, added sugar, and fat. An example eating plan is called the DASH (Dietary Approaches to Stop Hypertension) diet. To eat this way: ? Eat plenty of fresh fruits and vegetables. Try to fill one half of your plate at each meal with fruits and vegetables. ? Eat whole grains, such as whole-wheat pasta, brown rice, or whole-grain bread. Fill about one fourth of your plate with whole grains. ? Eat or drink low-fat dairy products, such as skim milk or low-fat yogurt. ? Avoid fatty cuts of meat, processed or cured meats, and poultry with skin. Fill about one fourth of your plate with lean proteins, such as fish, chicken without skin, beans, eggs, or tofu. ? Avoid pre-made and processed foods. These tend to be higher in sodium, added sugar, and fat.  Reduce your daily sodium intake. Most people with hypertension should eat less than 1,500 mg of sodium a day.  Do not drink alcohol if: ? Your health care provider tells you not to drink. ? You are pregnant, may be pregnant, or are planning to become pregnant.  If you drink alcohol: ? Limit how much you use to:  0-1 drink a day for women.  0-2 drinks a day for men. ? Be aware of how much alcohol is in your drink. In the [place] one drink equals one 12 oz bottle of beer (355 mL), one 5 oz glass of wine (148 mL), or one 1 oz glass of hard liquor (44 mL). Lifestyle   Work with your health care provider to maintain a healthy body weight or to lose  weight. Ask what an ideal weight is for you.  Get at least 30 minutes of exercise most days of the week. Activities may include  walking, swimming, or biking.  Include exercise to strengthen your muscles (resistance exercise), such as Pilates or lifting weights, as part of your weekly exercise routine. Try to do these types of exercises for 30 minutes at least 3 days a week.  Do not use any products that contain nicotine or tobacco, such as cigarettes, e-cigarettes, and chewing tobacco. If you need help quitting, ask your health care provider.  Monitor your blood pressure at home as told by your health care provider.  Keep all follow-up visits as told by your health care provider. This is important. Medicines  Take over-the-counter and prescription medicines only as told by your health care provider. Follow directions carefully. Blood pressure medicines must be taken as prescribed.  Do not skip doses of blood pressure medicine. Doing this puts you at risk for problems and can make the medicine less effective.  Ask your health care provider about side effects or reactions to medicines that you should watch for. Contact a health care provider if you:  Think you are having a reaction to a medicine you are taking.  Have headaches that keep coming back (recurring).  Feel dizzy.  Have swelling in your ankles.  Have trouble with your vision. Get help right away if you:  Develop a severe headache or confusion.  Have unusual weakness or numbness.  Feel faint.  Have severe pain in your chest or abdomen.  Vomit repeatedly.  Have trouble breathing. Summary  Hypertension is when the force of blood pumping through your arteries is too strong. If this condition is not controlled, it may put you at risk for serious complications.  Your personal target blood pressure may vary depending on your medical conditions, your age, and other factors. For most people, a normal blood pressure is  less than 120/80.  Hypertension is treated with lifestyle changes, medicines, or a combination of both. Lifestyle changes include losing weight, eating a healthy, low-sodium diet, exercising more, and limiting alcohol. This information is not intended to replace advice given to you by your health care provider. Make sure you discuss any questions you have with your health care provider. Document Revised: 03/05/2018 Document Reviewed: 03/05/2018 Elsevier Patient Education  2020 [employer].

## 2019-08-20 NOTE — Assessment & Plan Note (Signed)
Persistently elevated blood pressure. Continue losartan 100 mg daily.  Continue amlodipine 10 mg daily.  Continue carvedilol 12.5 mg twice a day.  We will add clonidine 0.1 mg twice a day. Uncontrolled diabetes with hemoglobin A1c at 12.3.  Unable to take metformin due to interaction with Diamox. Continue Bydureon 2 mg weekly.  Increase glipizide to 10 mg twice a day.  Stop Januvia and start Jardiance or Invokana as per insurance. Follow-up in 3 months.

## 2019-08-21 LAB — CBC WITH DIFFERENTIAL/PLATELET
Basophils Absolute: 0.1 10*3/uL (ref 0.0–0.2)
Basos: 1 %
EOS (ABSOLUTE): 0.2 10*3/uL (ref 0.0–0.4)
Eos: 2 %
Hematocrit: 44.7 % (ref 37.5–51.0)
Hemoglobin: 15.2 g/dL (ref 13.0–17.7)
Immature Grans (Abs): 0 10*3/uL (ref 0.0–0.1)
Immature Granulocytes: 0 %
Lymphocytes Absolute: 2.4 10*3/uL (ref 0.7–3.1)
Lymphs: 31 %
MCH: 30.5 pg (ref 26.6–33.0)
MCHC: 34 g/dL (ref 31.5–35.7)
MCV: 90 fL (ref 79–97)
Monocytes Absolute: 0.8 10*3/uL (ref 0.1–0.9)
Monocytes: 11 %
Neutrophils Absolute: 4.3 10*3/uL (ref 1.4–7.0)
Neutrophils: 55 %
Platelets: 328 10*3/uL (ref 150–450)
RBC: 4.99 x10E6/uL (ref 4.14–5.80)
RDW: 12.2 % (ref 11.6–15.4)
WBC: 7.8 10*3/uL (ref 3.4–10.8)

## 2019-08-21 LAB — COMPREHENSIVE METABOLIC PANEL
ALT: 23 IU/L (ref 0–44)
AST: 15 IU/L (ref 0–40)
Albumin/Globulin Ratio: 1.3 (ref 1.2–2.2)
Albumin: 4.1 g/dL (ref 4.0–5.0)
Alkaline Phosphatase: 76 IU/L (ref 39–117)
BUN/Creatinine Ratio: 8 — ABNORMAL LOW (ref 9–20)
BUN: 11 mg/dL (ref 6–24)
Bilirubin Total: 0.3 mg/dL (ref 0.0–1.2)
CO2: 20 mmol/L (ref 20–29)
Calcium: 9.5 mg/dL (ref 8.7–10.2)
Chloride: 102 mmol/L (ref 96–106)
Creatinine, Ser: 1.42 mg/dL — ABNORMAL HIGH (ref 0.76–1.27)
GFR calc Af Amer: 71 mL/min/{1.73_m2} (ref >59)
GFR calc non Af Amer: 61 mL/min/{1.73_m2} (ref >59)
Globulin, Total: 3.1 g/dL (ref 1.5–4.5)
Glucose: 395 mg/dL — ABNORMAL HIGH (ref 65–99)
Potassium: 4.3 mmol/L (ref 3.5–5.2)
Sodium: 135 mmol/L (ref 134–144)
Total Protein: 7.2 g/dL (ref 6.0–8.5)

## 2019-08-21 LAB — LIPID PANEL
Chol/HDL Ratio: 6.4 ratio — ABNORMAL HIGH (ref 0.0–5.0)
Cholesterol, Total: 154 mg/dL (ref 100–199)
HDL: 24 mg/dL — ABNORMAL LOW (ref >39)
LDL Chol Calc (NIH): 75 mg/dL (ref 0–99)
Triglycerides: 339 mg/dL — ABNORMAL HIGH (ref 0–149)
VLDL Cholesterol Cal: 55 mg/dL — ABNORMAL HIGH (ref 5–40)

## 2019-08-23 ENCOUNTER — Other Ambulatory Visit: Payer: Self-pay | Admitting: Emergency Medicine

## 2019-08-23 DIAGNOSIS — Z8639 Personal history of other endocrine, nutritional and metabolic disease: Secondary | ICD-10-CM

## 2019-08-24 ENCOUNTER — Encounter: Payer: Self-pay | Admitting: Emergency Medicine

## 2019-08-24 MED ORDER — POTASSIUM CHLORIDE CRYS ER 10 MEQ PO TBCR: 10.0000 meq | EXTENDED_RELEASE_TABLET | Freq: Two times a day (BID) | ORAL | 1 refills | Status: AC

## 2019-08-25 ENCOUNTER — Other Ambulatory Visit: Payer: Self-pay | Admitting: Emergency Medicine

## 2019-08-25 ENCOUNTER — Telehealth: Payer: Self-pay

## 2019-08-25 DIAGNOSIS — I10 Essential (primary) hypertension: Secondary | ICD-10-CM

## 2019-08-25 MED ORDER — CARVEDILOL 25 MG PO TABS: 25.0000 mg | ORAL_TABLET | Freq: Two times a day (BID) | ORAL | 3 refills | Status: AC

## 2019-08-25 NOTE — Telephone Encounter (Signed)
Pt.'s caretaker called on behalf of the pt. With a question. The caretaker observed that the pt. Was sleeping more frequently than previously. The pt. Had wanted to know why that might be. Pt. Advised they may need to come in for a OV

## 2019-08-25 NOTE — Telephone Encounter (Signed)
Tried to reach out to pt but was unable to LVM. There was no name attached to who the caretaker was and no #. I looked back on the consent to release information for and call Ms. Davis(ex girlfriend now) she stated that she would give him a call bc they still talk and let him know that the office is trying to reach him.

## 2019-10-12 ENCOUNTER — Encounter: Payer: Self-pay | Admitting: Emergency Medicine

## 2019-11-19 ENCOUNTER — Ambulatory Visit: Payer: Medicaid Other | Admitting: Emergency Medicine

## 2019-11-26 ENCOUNTER — Ambulatory Visit: Payer: Medicaid Other | Admitting: Emergency Medicine

## 2019-11-27 ENCOUNTER — Encounter: Payer: Self-pay | Admitting: Emergency Medicine

## 2020-04-13 ENCOUNTER — Encounter: Payer: Self-pay | Admitting: Emergency Medicine

## 2020-06-26 IMAGING — DX DG RIBS W/ CHEST 3+V*L*
4 series · 4 of 4 positions shown · non-contrast
Comparison: December 17, 2017.

CLINICAL DATA: Left rib pain.

EXAM:
LEFT RIBS AND CHEST - 3+ VIEW

[chest pa]
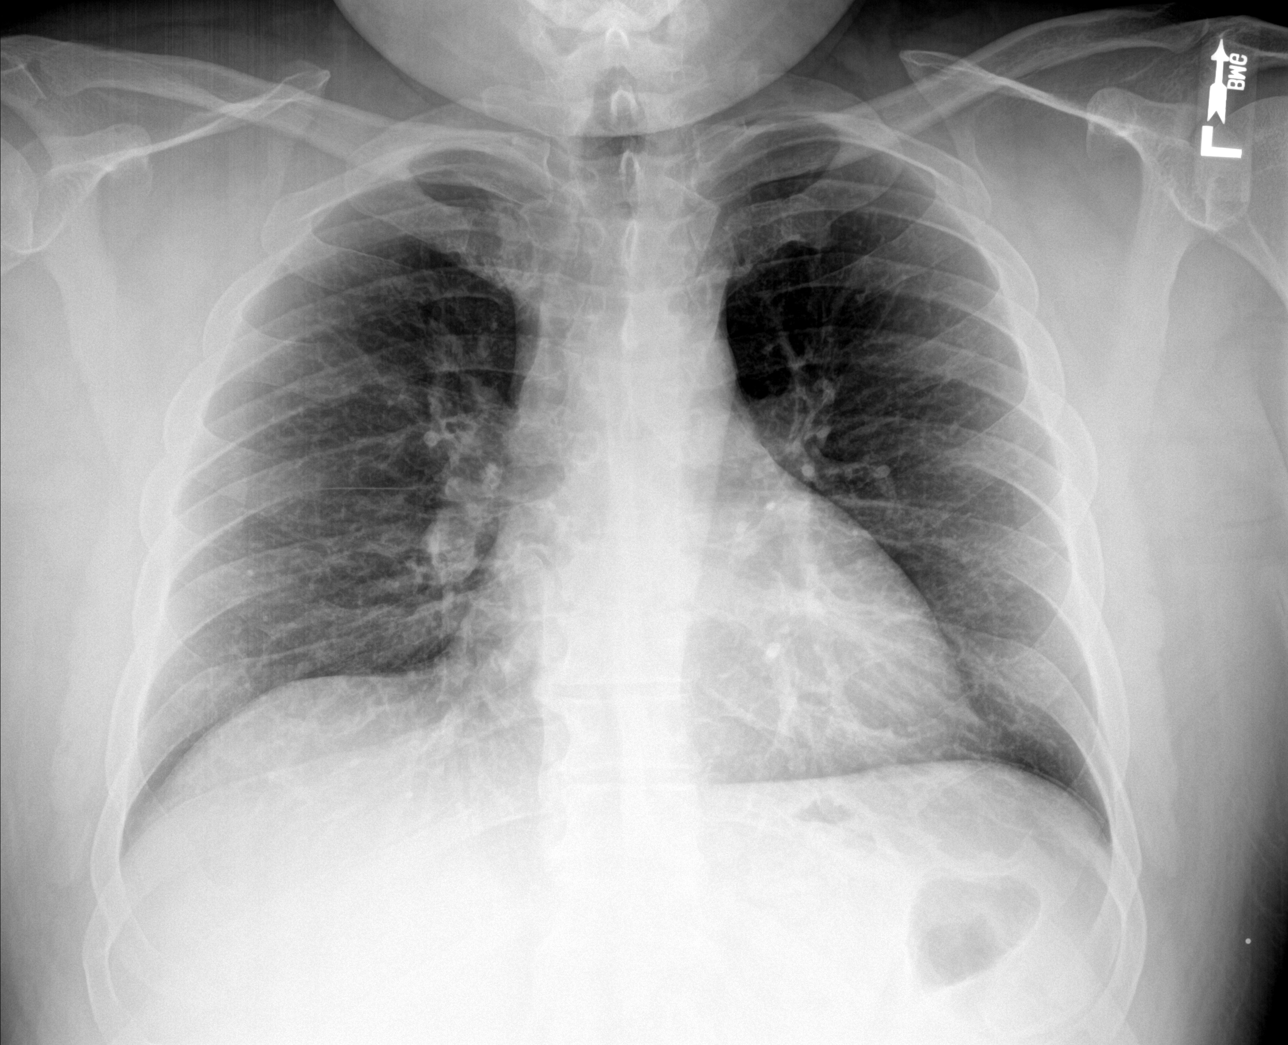

[rib obl (1 of 2)]
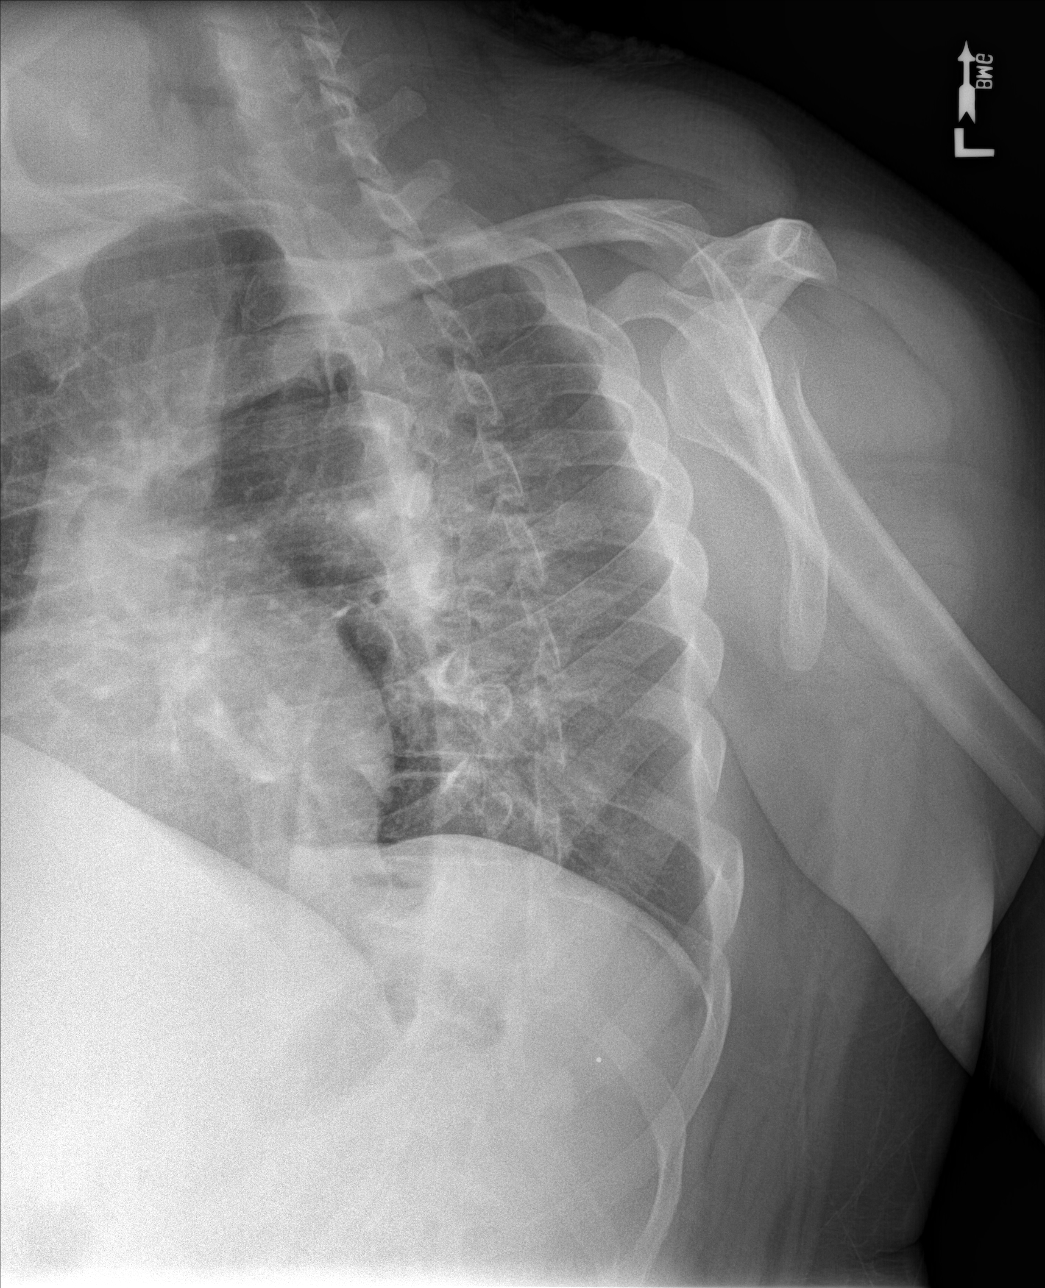

[rib obl (2 of 2)]
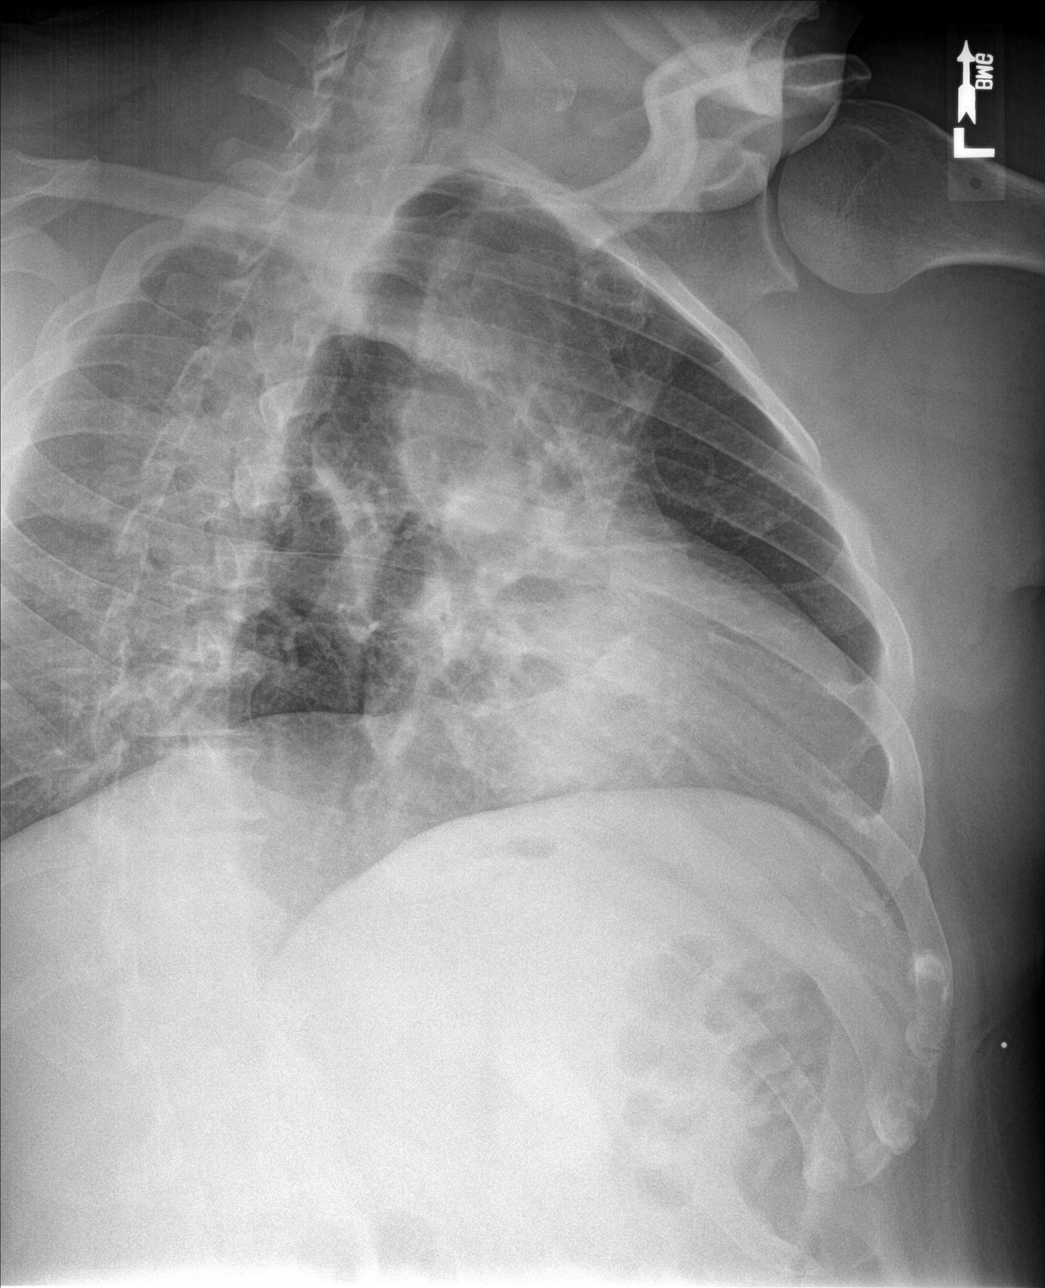

[chest ap]
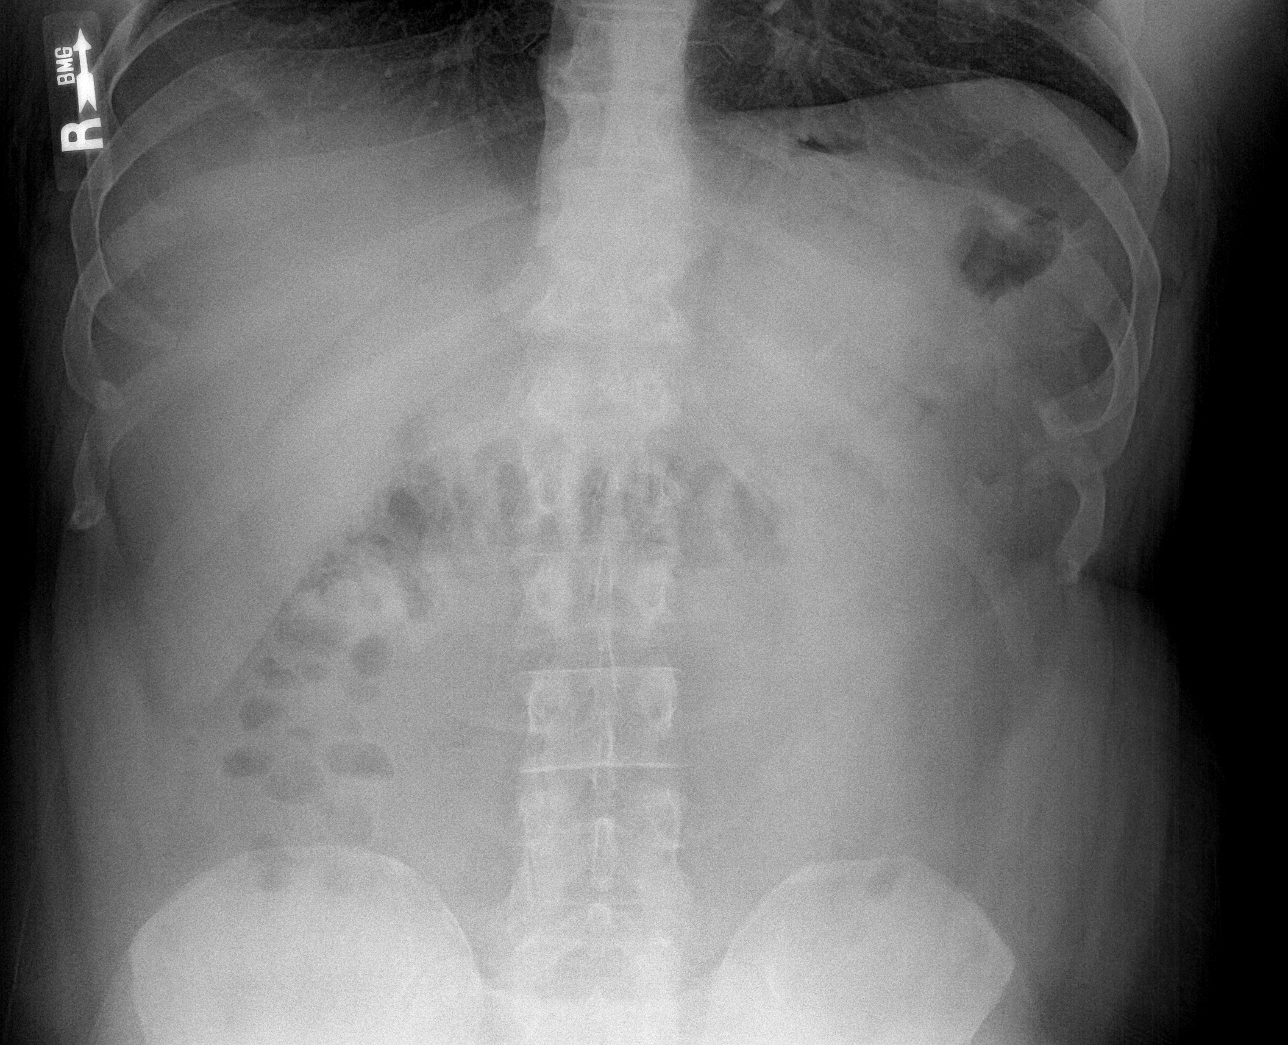

[4 of 4 positions shown; findings below may reference images not displayed]

FINDINGS: No fracture or other bone lesions are seen involving the ribs. There
is no evidence of pneumothorax or pleural effusion. Both lungs are
clear. Heart size and mediastinal contours are within normal limits.
IMPRESSION: Negative.
